# Patient Record
Sex: Male | Born: 1970 | Race: Black or African American | Hispanic: No | Marital: Single | Smoking: Never smoker
Health system: Southern US, Community
[De-identification: ages and names within clinical notes are randomized; demographics above are authoritative.]

## PROBLEM LIST (undated history)

## (undated) ENCOUNTER — Emergency Department (HOSPITAL_COMMUNITY): Payer: Self-pay | Source: Home / Self Care

## (undated) ENCOUNTER — Ambulatory Visit: Admission: EM | Discharge status: Absent without leave | Payer: Managed Care, Other (non HMO)

## (undated) ENCOUNTER — Emergency Department: Discharge status: Absent without leave | Payer: Self-pay

## (undated) DIAGNOSIS — I10 Essential (primary) hypertension: Secondary | ICD-10-CM

## (undated) DIAGNOSIS — Z9889 Other specified postprocedural states: Secondary | ICD-10-CM

## (undated) DIAGNOSIS — E785 Hyperlipidemia, unspecified: Secondary | ICD-10-CM

## (undated) HISTORY — PX: RECTAL SURGERY: SHX760

---

## 2003-09-13 ENCOUNTER — Emergency Department (HOSPITAL_COMMUNITY): Admission: EM | Admit: 2003-09-13 | Discharge: 2003-09-13 | Payer: Self-pay | Admitting: Emergency Medicine

## 2006-02-11 ENCOUNTER — Ambulatory Visit (HOSPITAL_COMMUNITY): Admission: RE | Admit: 2006-02-11 | Discharge: 2006-02-11 | Payer: Self-pay | Admitting: General Surgery

## 2006-02-19 ENCOUNTER — Ambulatory Visit: Payer: Self-pay | Admitting: Internal Medicine

## 2006-02-19 ENCOUNTER — Inpatient Hospital Stay (HOSPITAL_COMMUNITY): Admission: EM | Admit: 2006-02-19 | Discharge: 2006-03-06 | Payer: Self-pay | Admitting: *Deleted

## 2006-02-22 ENCOUNTER — Ambulatory Visit: Payer: Self-pay | Admitting: Infectious Diseases

## 2006-02-23 ENCOUNTER — Encounter: Payer: Self-pay | Admitting: Cardiology

## 2007-06-09 ENCOUNTER — Ambulatory Visit (HOSPITAL_BASED_OUTPATIENT_CLINIC_OR_DEPARTMENT_OTHER): Admission: RE | Admit: 2007-06-09 | Discharge: 2007-06-09 | Payer: Self-pay | Admitting: General Surgery

## 2010-10-08 NOTE — Op Note (Signed)
NAMELEONIDUS, ROWAND          ACCOUNT NO.:  [id number]   MEDICAL RECORD NO.:  [id number]          PATIENT TYPE:  AMB   LOCATION:  DSC                          FACILITY:  [facility]   PHYSICIAN:  Leonie Man, M.D.   DATE OF BIRTH:  03/20/1971   DATE OF PROCEDURE:  06/09/2007  DATE OF DISCHARGE:                               OPERATIVE REPORT   PREOPERATIVE DIAGNOSIS:  Anal fistula.   POSTOPERATIVE DIAGNOSIS:  Anal fistula.   PROCEDURE:  Anal fistulotomy.   SURGEON:  Leonie Man, M.D.   ASSISTANT:  Operating room technician.   ANESTHESIA:  General anesthesia.   INDICATIONS FOR PROCEDURE:  The patient is a 40 year old man, presenting  with drainage from the anus and noted on examination to have a fistula.  This appears on probing to be entirely submucosal without any sphincter  muscle involvement.  The patient comes to the operating room now for a  fistulotomy, after the risks and potential benefits of surgery have been  fully discussed with him and he accepts these.   DESCRIPTION OF PROCEDURE:  The patient is positioned in the lithotomy  position, following the induction of satisfactory general anesthesia.  The perianal tissues are prepped and draped, to be included in the  sterile operative field.  A digital dilatation of the anus is carried  out.  The bowel prep was noted to be poor.  Immediately I was able to  identify the fistula and was running a probe and grooved erector in  through the fistula.  I injected the overlying and surrounding tissues  with 0.5% Marcaine with epinephrine.  Using electrocautery, this was  dissected down to the base of the fistula.  The base of the fistula was  curetted and cauterized.  A Xeroform gauze pack was placed in the  fistula.  A sterile dressing was applied.  The anesthetic was reversed.   The patient was removed from the operating room to the recovery room in  stable condition.  He tolerated the procedure well.      Leonie Man, M.D.  Electronically Signed     PB/MEDQ  D:  06/09/2007  T:  06/09/2007  Job:  [id number]

## 2010-10-11 NOTE — Op Note (Signed)
Jerry Crane, Jerry Crane          ACCOUNT NO.:  [id number]   MEDICAL RECORD NO.:  [id number]          PATIENT TYPE:  INP   LOCATION:  0165                         [facility]ACILITY:  [facility]   PHYSICIAN:  Leonie Man, M.D.   DATE OF BIRTH:  January 04, 1971   DATE OF PROCEDURE:  02/19/2006  DATE OF DISCHARGE:                                 OPERATIVE REPORT   PREOPERATIVE DIAGNOSES:  Perineal (Fournier gangrene) with ischiorectal  abscess.   POSTOPERATIVE DIAGNOSES:  Perineal (Fournier gangrene) with ischiorectal  abscess.   PROCEDURE:  Incision and drainage of ischiorectal abscess with debridement  of Fournier gangrene of the perineum.   SURGEON:  Leonie Man, M.D.   SURGICAL CONSULTANT:  Adolph Pollack, M.D.   ANESTHESIA:  General.   NOTE:  Jerry Crane is a 41 year old man who presented with severe perineal  and buttock pain associated with nausea and vomiting.  On evaluation, noted  to have a large buttock abscess and a CT scan showing what was consistent  with Fournier gangrene of the perineum with infection extending up into the  inguinal canal and down into the buttocks, perirectal area and the  periprosthetic region.  The patient comes to the operating room now for  incision and drainage. The risks and potential benefits of surgery have been  discussed with him.   PROCEDURE:  Following the induction of satisfactory general anesthesia the  patient was positioned in the lithotomy position and the perianal tissues  and scrotum and groin prepped and draped after being included in a sterile  operative field.  I made a pararectal incision on the left buttock into a  large abscess cavity, extended the incision up to the perineum into his  subscrotal area and down into the buttock and debrided a very large abscess  cavity.  Cultures were taken, both aerobic and anaerobic bacteria.  Dissection was carried up into the inguinal ligament and I used a pulsatile  lavage apparatus to  debride the wound and to wash the entire abscess cavity  out to the largest extent possible.  The hemostasis was obtained with  electrocautery or with suture ligatures of 0 chromic catgut.  Sponge and  instrument counts were then verified and the wound was packed with saline-  dampened gauze packing, extending the packing up into the inguinal canal and  down into the buttock and perirectal areas.  Sterile dressings were then  applied, anesthetic reversed and the patient moved from the operating room  to the recovery room in stable condition.  He tolerated the procedure well.      Leonie Man, M.D.  Electronically Signed     PB/MEDQ  D:  02/19/2006  T:  02/21/2006  Job:  [id number]

## 2010-10-11 NOTE — Consult Note (Signed)
Jerry Crane, Jerry Crane          ACCOUNT NO.:  [id number]   MEDICAL RECORD NO.:  [id number]          PATIENT TYPE:  INP   LOCATION:  0165                         FACILITY:  [facility]   PHYSICIAN:  Pricilla Riffle, MD, FACCDATE OF BIRTH:  1970/09/03   DATE OF CONSULTATION:  02/20/2006  DATE OF DISCHARGE:                                   CONSULTATION   IDENTIFICATION:  The patient is a 40 year old whom we are asked to see  regarding PVC's.   HISTORY OF PRESENT ILLNESS:  The patient was admitted on February 19, 2006  for sphincterotomy for anal fissure/cellulitis of the left buttock, noted to  be in bigeminy this afternoon with couplets at around 1755.   In talking with the patient, he denied palpitations today or in the past.  No shortness of breath.  No chest pain.  No dizziness.  No syncope.  He was  very active prior to admission.   ALLERGIES:  NONE.   MEDICATIONS:  Now are Phenergan, Dilaudid, Unasyn and Primaxin.   PAST MEDICAL HISTORY:  Negative except as above.   FAMILY HISTORY:  No history of palpitations, arrhythmia or sudden death.   SOCIAL HISTORY:  He does not smoke and does not drink.   REVIEW OF SYSTEMS:  All systems reviewed, negative to the above problem,  except as noted.   PHYSICAL EXAMINATION:  GENERAL:  On exam the patient denies shortness of  breath, no chest pain.  VITAL SIGNS:  Blood pressure is 80-130's systolic, pulse is 80's-90's-110's.  T-max is 101.  I's and O's +1300.  HEENT:  Normocephalic, atraumatic.  EOMI.  PERRL.  NECK:  JVP is normal; no bruits.  LUNGS:  Clear.  CARDIAC:  Regular rate and rhythm.  S1 and S2; no S3 or S4.  No murmurs.  PMI not displaced.  ABDOMEN:  Benign.  EXTREMITIES:  2+ pulses.  No edema.   LABORATORY DATA:  Significant for a hemoglobin of 10.8, WBC of 19.3,  normocytic, normochromic indices.  Creatinine is 2.7 and potassium is 4.2   Urinalysis yesterday - specific gravity is 1.027.   RADIOLOGIC DATA:  Chest x-ray,  portable, shows cardiomegaly.   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  No 12-lead to capture.  EKG tonight shows tachycardia with a rate of 106, T wave inversion in lead  III and V3.   It appears his only precipitating factor is infection/hyperadrenergic state.  Exam is unremarkable.  EKG is unremarkable, other than sinus tachycardia.   RECOMMENDATIONS:  1. Would continue telemetry.  2. Add low-dose beta-blocker (Lopressor 25 b.i.d.) to his regimen.  3. Follow electrolytes.  4. 12-lead EKG if rhythm recurs.  5. Patient should have an echo at some point, even after discharge.  6. Will continue to follow.           ______________________________  Pricilla Riffle, MD, [facility]     PVR/MEDQ  D:  02/20/2006  T:  02/22/2006  Job:  [id number]   cc:  Leonie Man, M.D.  [address]  [address]  [place]  [place] [address]

## 2010-10-11 NOTE — Op Note (Signed)
NAMESAIVION, Jerry Crane          ACCOUNT NO.:  [id number]   MEDICAL RECORD NO.:  [id number]          PATIENT TYPE:  INP   LOCATION:  0165                         FACILITY:  [facility]   PHYSICIAN:  Leonie Man, M.D.   DATE OF BIRTH:  10/18/1970   DATE OF PROCEDURE:  02/25/2006  DATE OF DISCHARGE:                                 OPERATIVE REPORT   PREOPERATIVE DIAGNOSIS:  Necrotizing perineal infection.   POSTOPERATIVE DIAGNOSIS:  Necrotizing perineal infection.   PROCEDURE:  Debridement of peritoneal wound.   SURGEON:  Leonie Man, M.D.   ASSISTANT:  OR tech.   ANESTHESIA:  General.  [name] has been being brought back to the  operating room on an every other day basis for aggressive debridement of a  Fournier's gangrene of the perineum.  He comes to the operating room today  fully aware of the risks and potential benefits of surgery.   PROCEDURE:  Following induction of satisfactory general anesthesia, the  patient is placed in lithotomy position.  The previous packing was removed  from the wound and the area is prepped and draped to be included in a  sterile operative field and the wound is debrided of additional small  amounts of necrotic tissue.  Most the wound now is clean and granulating  satisfactorily.  There are no new pockets found within the interstices of  the wound.  Pulse lavage apparatus was used to hydro lavage to clean the  wound out.  A few bleeding points were treated with electrocautery and a  Kerlix gauze packing was placed into the interstices of the wound.  Sterile  dressings applied, the anesthetic reversed and the patient removed from the  operating room to the recovery room in stable condition.  He tolerated the  procedure well.      Leonie Man, M.D.  Electronically Signed     PB/MEDQ  D:  02/25/2006  T:  02/27/2006  Job:  [id number]

## 2010-10-11 NOTE — Discharge Summary (Signed)
Jerry Crane, Jerry Crane          ACCOUNT NO.:  [id number]   MEDICAL RECORD NO.:  [id number]          PATIENT TYPE:  INP   LOCATION:  1610                         FACILITY:  [facility]   PHYSICIAN:  Leonie Man, M.D.   DATE OF BIRTH:  1971-05-21   DATE OF ADMISSION:  02/19/2006  DATE OF DISCHARGE:  03/06/2006                               DISCHARGE SUMMARY   ADMISSION DIAGNOSIS:  Necrotizing perineal infection (Fournier's  gangrene).   DISCHARGE DIAGNOSIS:  Necrotizing perineal infection (Fournier's  gangrene).   PROCEDURES IN HOSPITAL:  debridement of necrotizing perineal infection  on February 19, 2006, September 29,2007, and February 23, 2006, February 24, 2006, and February 25, 2006.   CONSULTATIONS:  1. Infectious disease, Dr. Lina Sayre.  Antibiotic treatment      initiated with imipenem and vancomycin.  2. Dr. Dietrich Pates for evaluation of persistent tachycardia, without      any precise source except sepsis, treated unsuccessfully with      Lopressor.   DISCHARGE MEDICATIONS:  1. Augmentin 875 mg b.i.d. x2 weeks.  2. Vicodin 5/500 1 tablet q.4-6 h. p.r.n.   HOSPITAL COURSE:  [name] is a 41 year old man who was admitted to  the hospital for a large necrotizing perineal infection.  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  At the time of admission, he was noted to have a  very large necrotizing perineal infection and was immediately taken to  the operating room, where he underwent extensive debridement of the  perineum.  He was started on imipenem, and subsequently we added  vancomycin to his antibiotic regimen which was continued throughout the  course of his admission. He was taken to the operating room on several  occasions, as noted above, for more debridement of the perineum until  the infection came under local control.  During the course of his  hospitalization, he became quite tachycardiac and  was seen by Dr. Dietrich Pates for evaluation.  Her recommendations for treatment were to add low-  dose Lopressor, approximately 25 mg b.i.d. over a period of time.  All  cardiac evaluations otherwise were normal.  The patient improved over  the ensuing hospitalization, and on March 06, 2006 he was deemed fit  for discharge.  He is discharged with [facility] to  continue local care of his perineal wound, to be switched after a  certain period of time to local care done by the patient and his family.  Followup for the patient is scheduled for one week following discharge.      Leonie Man, M.D.  Electronically Signed     PB/MEDQ  D:  04/09/2006  T:  04/09/2006  Job:  [id number]

## 2010-10-11 NOTE — Op Note (Signed)
Jerry Crane, Jerry Crane          ACCOUNT NO.:  [id number]   MEDICAL RECORD NO.:  [id number]          PATIENT TYPE:  INP   LOCATION:  0165                         FACILITY:  [facility]   PHYSICIAN:  Clovis Pu. Cornett, M.D.DATE OF BIRTH:  [birth year]   DATE OF PROCEDURE:  02/22/2006  DATE OF DISCHARGE:                                 OPERATIVE REPORT   PREOPERATIVE DIAGNOSIS:  Fournier's gangrene.   POSTOPERATIVE DIAGNOSIS:  Fournier's gangrene.   PROCEDURE:  Irrigation and debridement of perineum.   SURGEON:  Maisie Fus A. Cornett, M.D.   ANESTHESIA:  LMA.   ESTIMATED BLOOD LOSS:  Less than 10 mL.   DRAINS:  None.   INDICATIONS FOR PROCEDURE:  The patient is a 40 year old male who had a  sphincterotomy with subsequent Fournier's gangrene.  He is brought back to  the operating room today for irrigation and debridement of his wounds since  he had copious amount of drainage this morning on rounds.  I explained this  to the patient and he voiced understanding and agreed to proceed.   DESCRIPTION OF PROCEDURE:  Patient is brought to the operating room and  placed supine.  After induction of anesthesia, he was placed in lithotomy.  Perineum was prepped and draped in sterile fashion.  Old packing was removed  from his left buttock wound and around my finger both inferior and  superiorly up toward the scrotum.  There were no undrained fluid  collections.  I irrigated the entire wound out with three liters of saline  using the Pulsavac.  I then repacked this with Betadine soaked Kerlix.  Dry  dressings were applied.  The patient tolerated the procedure well and was  taken to recovery in satisfactory condition.      Thomas A. Cornett, M.D.  Electronically Signed     TAC/MEDQ  D:  02/22/2006  T:  02/23/2006  Job:  [id number]

## 2010-10-11 NOTE — H&P (Signed)
Jerry Crane, Jerry Crane          ACCOUNT NO.:  [id number]   MEDICAL RECORD NO.:  [id number]          PATIENT TYPE:  INP   LOCATION:  1610                         FACILITY:  [facility]   PHYSICIAN:  Leonie Man, M.D.   DATE OF BIRTH:  [birth year]   DATE OF ADMISSION:  02/19/2006  DATE OF DISCHARGE:  03/06/2006                              HISTORY & PHYSICAL   PROBLEM:  Gas forming perineal infection.   The patient is a 40 year old man who is status post lateral internal  sphincterotomy for chronic anal fissure performed on February 11, 2006.  He did well until February 15, 2006 where he noted the onset of severe  perineal pain and on presentation was noted to have a large swollen  buttock and perineum.  He is admitted to [facility] now  following CT scans of the pelvis which showed gas forming perineal  infection consistent with a Fournier's' type gangrene.   PAST MEDICAL HISTORY:  No previous medical problems.  No no known  allergies.   PAST SURGERIES:  Status post lateral internal sphincterotomy.   FAMILY AND SOCIAL HISTORY:  Noncontributory.   REVIEW OF SYSTEMS:  Negative in detail except as outlined above.   PHYSICAL EXAMINATION:  The patient on admission has a blood pressure of  92/46 with a pulse of 110.  His respirations are 16.  HEENT: Head normocephalic.  Pupils round, regular.  No thyromegaly or  adenopathy.  CHEST:  Clear to auscultation.  HEART:  Tachycardia without murmurs.  ABDOMEN:  Soft, nontender, nondistended.  EXTREMITIES:  Range of motion within normal limits.  RECTAL:  Perineal exam shows a large swollen buttock area which is  severely tender and erythematous.   LABORATORY FINDINGS:  Shows white blood cell count be elevated to 22.7,  hemoglobin 13.1, hematocrit 37.  Urinalysis shows 3 to 6  white blood  cells per high-power field with many bacteria. His serum creatinine is  elevated to 3.2 with a BUN of 50.   ASSESSMENT:  Fournier's'  gangrene of the perineum following lateral  internal sphincterotomy.   PLAN:  To take him to the operating room for incision, drainage and  debridement of the perineum.     Leonie Man, M.D.  Electronically Signed    PB/MEDQ  D:  04/09/2006  T:  04/09/2006  Job:  [id number]

## 2010-10-11 NOTE — Op Note (Signed)
NAMEEVERITT, WENNER          ACCOUNT NO.:  [id number]   MEDICAL RECORD NO.:  [id number]          PATIENT TYPE:  AMB   LOCATION:  DAY                          FACILITY:  [facility]   PHYSICIAN:  Leonie Man, M.D.   DATE OF BIRTH:  [birth year]   DATE OF PROCEDURE:  02/11/2006  DATE OF DISCHARGE:                                 OPERATIVE REPORT   PREOPERATIVE DIAGNOSIS:  Chronic anal fissure.   POSTOPERATIVE DIAGNOSIS:  Chronic anal fissure.   PROCEDURE:  Lateral internal sphincterotomy.   SURGEON:  Leonie Man, M.D.   ASSISTANT:  Nurse.   ANESTHESIA:  General.   Mr. Evon Slack is a 40 year old [place] man who has a chronic nonhealing anal  fissure.  Treated only partially successfully with diltiazem cream but  continues to have pain and bleeding.  He comes to the operating room now  after risks and potential benefits of lateral internal sphincterotomy had  been fully discussed with him including the risk of possible incontinence.  He understands and gives consent to same.   PROCEDURE:  With the patient positioned in the lithotomy position following  induction of satisfactory general anesthesia, perianal tissues are prepped  and draped to be included in a sterile operative field.  Anal sphincter  dilated up to three fingerbreadths and an anoscope was placed into the  rectum.  His rectal prep was poor.  The region of the intersphincteric  groove was palpated and the mucosa over the internal sphincter was  infiltrated with 0.5% Marcaine with epinephrine.  The mucosa was opened and  a hemostat inserted into the intersphincteric groove elevating.  The lateral  portion of the internal sphincter, I used an 11 blade to cut through the  lateral internal sphincter up to approximately 1.5 cm.  Then closed the  overlying mucosa with interrupted sutures of 2-0 chromic and held pressure  on the wound.  At the end of this time all bleeding had subsided.  Sponge  and instrument counts  were then verified.  I placed a Gelfoam patch over the  incision.  The anesthetic was then reversed and the patient removed from the  operating room to the recovery room in stable condition.  He tolerated the  procedure well.      Leonie Man, M.D.  Electronically Signed     PB/MEDQ  D:  02/11/2006  T:  02/12/2006  Job:  [id number]

## 2010-10-11 NOTE — Op Note (Signed)
NAMEAXCEL, HORSCH          ACCOUNT NO.:  [id number]   MEDICAL RECORD NO.:  [id number]          PATIENT TYPE:  INP   LOCATION:  0165                         FACILITY:  [facility]   PHYSICIAN:  Leonie Man, M.D.   DATE OF BIRTH:  [birth year]   DATE OF PROCEDURE:  02/23/2006  DATE OF DISCHARGE:                                 OPERATIVE REPORT   PREOPERATIVE DIAGNOSIS:  Necrotizing perineal infection.   POSTOPERATIVE DIAGNOSIS:  Necrotizing perineal infection.   PROCEDURE:  Debridement perineal wound number 4 with removal of skin and  soft tissue.   SURGEON:  Leonie Man, M.D.   ASSISTANT:  OR tech.   ANESTHESIA:  General.   NOTE:  Mr. Ninfa Meeker is a 40 year old man with necrotizing perineal  infection.  He was brought to the operating room originally on February 19, 2006, and he is being brought back to the operating room on now his fourth  occasion for debridement of Fournier's gangrene of the perineum.   PROCEDURE:  The patient is positioned supinely and then placed in lithotomy  position. Following induction of satisfactory general anesthesia, the  packing placed in the depths of the wound was removed and the perineum was  prepped and draped to be included in a sterile operative field.  A piece of  necrotic skin and the subcutaneous tissues was debrided from the perineum as  well as several additional areas of necrotizing fatty tissue.  We have  gotten down to clean granulating muscular tissue at this point.  There is a  tunnel that goes up along the left pararectal ischiorectal space which was  opened.  I used a pulse lavage apparatus to irrigate that cavity as well as  the cavity going up into the left lateral scrotum and into the inguinal  canal.  There were other cavities going posteriorly into the buttock which  were cleaned and debrided of necrotic tissues.  The pulse lavage apparatus  was used to irrigate out all of the debris that could be seen. Hemostasis  was obtained with electrocautery and with sutures of 0 chromic catgut.  Following debridement and irrigation, the wound was packed so as to make  sure packing went up into the inguinal canal into the parascrotal area and  into the ischiorectal fossa as well as down into the interstices within the  buttocks.  This was done with a Kerlix gauze soaked in dilute Betadine  solution.  A sterile dressing was then placed on the wound.  The anesthetic  was reversed and the patient removed from the operating room to the recovery  room in stable condition. He tolerated the procedure well.      Leonie Man, M.D.  Electronically Signed     PB/MEDQ  D:  02/23/2006  T:  02/24/2006  Job:  [id number]

## 2010-10-11 NOTE — Op Note (Signed)
Jerry Crane, MULA          ACCOUNT NO.:  [id number]   MEDICAL RECORD NO.:  [id number]          PATIENT TYPE:  INP   LOCATION:  0165                         FACILITY:  [facility]   PHYSICIAN:  Leonie Man, M.D.   DATE OF BIRTH:  10/24/1970   DATE OF PROCEDURE:  02/21/2006  DATE OF DISCHARGE:                                 OPERATIVE REPORT   PREOPERATIVE DIAGNOSIS:  Necrotizing perineal infection.   POSTOPERATIVE DIAGNOSIS:  Necrotizing perineal infection.   PROCEDURE:  Exploration and debridement of necrotizing perineal wounds.   SURGEON:  Leonie Man, M.D.   ASSISTANT:  OR tech.   ANESTHESIA:  General.   INDICATIONS:  The patient is a 40 year old man who presents with a  necrotizing perineal infection (Fournier's) who underwent incision and  drainage and debridement of his perineum on the February 19, 2006.  He is  returned to the operating room now for continued debridement.   PROCEDURE:  Following the induction of satisfactory general anesthesia, the  patient is placed in stirrups in the lithotomy position.  The previously  placed gauze packing is removed and discarded.  The wound is then irrigated  with a pulse lavage apparatus and aspirated of all of its  purulent content.  Necrotic tissues within the depths of the wound of the perineum and left  buttock wound were sharply debrided.  Hemostasis was obtained with  electrocautery or with stitches of 0 chromic catgut.  All visible nonviable  tissues were removed all the way up from the inguinal canal all the way down  to the buttock and perineum.  The wound was then thoroughly irrigated with a  dilute Betadine solution, and a Kerlix gauze packing was placed into all of  the interstices within the wound.  A sterile dressing was applied.   The anesthetic was reversed and the patient removed to the PACU in stable  condition.  He tolerated the procedure well.      Leonie Man, M.D.  Electronically  Signed     PB/MEDQ  D:  02/21/2006  T:  02/23/2006  Job:  [id number]   cc:   Leonie Man, M.D.  [address]  [address]  [place]  [place] [address]

## 2011-02-13 LAB — POCT HEMOGLOBIN-HEMACUE: Hemoglobin: 14.9

## 2015-10-23 ENCOUNTER — Emergency Department (HOSPITAL_COMMUNITY)
Admission: EM | Admit: 2015-10-23 | Discharge: 2015-10-23 | Disposition: A | Discharge status: Home / Self Care | Payer: No Typology Code available for payment source | Attending: Emergency Medicine | Admitting: Emergency Medicine

## 2015-10-23 ENCOUNTER — Encounter (HOSPITAL_COMMUNITY): Payer: Self-pay | Admitting: Emergency Medicine

## 2015-10-23 DIAGNOSIS — Z791 Long term (current) use of non-steroidal anti-inflammatories (NSAID): Secondary | ICD-10-CM | POA: Diagnosis not present

## 2015-10-23 DIAGNOSIS — Y92410 Unspecified street and highway as the place of occurrence of the external cause: Secondary | ICD-10-CM | POA: Diagnosis not present

## 2015-10-23 DIAGNOSIS — M542 Cervicalgia: Secondary | ICD-10-CM | POA: Diagnosis not present

## 2015-10-23 DIAGNOSIS — I10 Essential (primary) hypertension: Secondary | ICD-10-CM | POA: Insufficient documentation

## 2015-10-23 DIAGNOSIS — Y939 Activity, unspecified: Secondary | ICD-10-CM | POA: Insufficient documentation

## 2015-10-23 DIAGNOSIS — M545 Low back pain: Secondary | ICD-10-CM | POA: Insufficient documentation

## 2015-10-23 DIAGNOSIS — Y999 Unspecified external cause status: Secondary | ICD-10-CM | POA: Insufficient documentation

## 2015-10-23 HISTORY — DX: Essential (primary) hypertension: I10

## 2015-10-23 MED ORDER — CYCLOBENZAPRINE HCL 10 MG PO TABS: 10.0000 mg | ORAL_TABLET | Freq: Two times a day (BID) | ORAL | Status: DC | PRN

## 2015-10-23 MED ORDER — IBUPROFEN 800 MG PO TABS
800.0000 mg | ORAL_TABLET | Freq: Once | ORAL | Status: AC
  Administered 2015-10-23: 800 mg via ORAL
  Filled 2015-10-23: qty 1

## 2015-10-23 MED ORDER — NAPROXEN 500 MG PO TABS: 500.0000 mg | ORAL_TABLET | Freq: Two times a day (BID) | ORAL | Status: DC
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
## 2015-10-23 NOTE — Discharge Instructions (Signed)
°Cervical Strain and Sprain With Rehab °Cervical strain and sprain are injuries that commonly occur with "whiplash" injuries. Whiplash occurs when the neck is forcefully whipped backward or forward, such as during a motor vehicle accident or during contact sports. The muscles, ligaments, tendons, discs, and nerves of the neck are susceptible to injury when this occurs. °RISK FACTORS °Risk of having a whiplash injury increases if: °· Osteoarthritis of the spine. °· Situations that make head or neck accidents or trauma more likely. °· High-risk sports (football, rugby, wrestling, hockey, auto racing, gymnastics, diving, contact karate, or boxing). °· Poor strength and flexibility of the neck. °· Previous neck injury. °· Poor tackling technique. °· Improperly fitted or padded equipment. °SYMPTOMS  °· Pain or stiffness in the front or back of neck or both. °· Symptoms may present immediately or up to 24 hours after injury. °· Dizziness, headache, nausea, and vomiting. °· Muscle spasm with soreness and stiffness in the neck. °· Tenderness and swelling at the injury site. °PREVENTION °· Learn and use proper technique (avoid tackling with the head, spearing, and head-butting; use proper falling techniques to avoid landing on the head). °· Warm up and stretch properly before activity. °· Maintain physical fitness: °¨ Strength, flexibility, and endurance. °¨ Cardiovascular fitness. °· Wear properly fitted and padded protective equipment, such as padded soft collars, for participation in contact sports. °PROGNOSIS  °Recovery from cervical strain and sprain injuries is dependent on the extent of the injury. These injuries are usually curable in 1 week to 3 months with appropriate treatment.  °RELATED COMPLICATIONS  °· Temporary numbness and weakness may occur if the nerve roots are damaged, and this may persist until the nerve has completely healed. °· Chronic pain due to frequent recurrence of symptoms. °· Prolonged healing,  especially if activity is resumed too soon (before complete recovery). °TREATMENT  °Treatment initially involves the use of ice and medication to help reduce pain and inflammation. It is also important to perform strengthening and stretching exercises and modify activities that worsen symptoms so the injury does not get worse. These exercises may be performed at home or with a therapist. For patients who experience severe symptoms, a soft, padded collar may be recommended to be worn around the neck.  °Improving your posture may help reduce symptoms. Posture improvement includes pulling your chin and abdomen in while sitting or standing. If you are sitting, sit in a firm chair with your buttocks against the back of the chair. While sleeping, try replacing your pillow with a small towel rolled to 2 inches in diameter, or use a cervical pillow or soft cervical collar. Poor sleeping positions delay healing.  °For patients with nerve root damage, which causes numbness or weakness, the use of a cervical traction apparatus may be recommended. Surgery is rarely necessary for these injuries. However, cervical strain and sprains that are present at birth (congenital) may require surgery. °MEDICATION  °· If pain medication is necessary, nonsteroidal anti-inflammatory medications, such as aspirin and ibuprofen, or other minor pain relievers, such as acetaminophen, are often recommended. °· Do not take pain medication for 7 days before surgery. °· Prescription pain relievers may be given if deemed necessary by your caregiver. Use only as directed and only as much as you need. °HEAT AND COLD:  °· Cold treatment (icing) relieves pain and reduces inflammation. Cold treatment should be applied for 10 to 15 minutes every 2 to 3 hours for inflammation and pain and immediately after any activity that aggravates your   HEAT AND COLD:   · Cold treatment (icing) relieves pain and reduces inflammation. Cold treatment should be applied for 10 to 15 minutes every 2 to 3 hours for inflammation and pain and immediately after any activity that aggravates your symptoms. Use ice packs or an ice massage.  · Heat treatment may be used prior to performing the stretching and  strengthening activities prescribed by your caregiver, physical therapist, or athletic trainer. Use a heat pack or a warm soak.  SEEK MEDICAL CARE IF:   · Symptoms get worse or do not improve in 2 weeks despite treatment.  · New, unexplained symptoms develop (drugs used in treatment may produce side effects).  EXERCISES  RANGE OF MOTION (ROM) AND STRETCHING EXERCISES - Cervical Strain and Sprain  These exercises may help you when beginning to rehabilitate your injury. In order to successfully resolve your symptoms, you must improve your posture. These exercises are designed to help reduce the forward-head and rounded-shoulder posture which contributes to this condition. Your symptoms may resolve with or without further involvement from your physician, physical therapist or athletic trainer. While completing these exercises, remember:   · Restoring tissue flexibility helps normal motion to return to the joints. This allows healthier, less painful movement and activity.  · An effective stretch should be held for at least 20 seconds, although you may need to begin with shorter hold times for comfort.  · A stretch should never be painful. You should only feel a gentle lengthening or release in the stretched tissue.  STRETCH- Axial Extensors  · Lie on your back on the floor. You may bend your knees for comfort. Place a rolled-up hand towel or dish towel, about 2 inches in diameter, under the part of your head that makes contact with the floor.  · Gently tuck your chin, as if trying to make a "double chin," until you feel a gentle stretch at the base of your head.  · Hold __________ seconds.  Repeat __________ times. Complete this exercise __________ times per day.   STRETCH - Axial Extension   · Stand or sit on a firm surface. Assume a good posture: chest up, shoulders drawn back, abdominal muscles slightly tense, knees unlocked (if standing) and feet hip width apart.  · Slowly retract your chin so your head slides back  and your chin slightly lowers. Continue to look straight ahead.  · You should feel a gentle stretch in the back of your head. Be certain not to feel an aggressive stretch since this can cause headaches later.  · Hold for __________ seconds.  Repeat __________ times. Complete this exercise __________ times per day.  STRETCH - Cervical Side Bend   · Stand or sit on a firm surface. Assume a good posture: chest up, shoulders drawn back, abdominal muscles slightly tense, knees unlocked (if standing) and feet hip width apart.  · Without letting your nose or shoulders move, slowly tip your right / left ear to your shoulder until your feel a gentle stretch in the muscles on the opposite side of your neck.  · Hold __________ seconds.  Repeat __________ times. Complete this exercise __________ times per day.  STRETCH - Cervical Rotators   · Stand or sit on a firm surface. Assume a good posture: chest up, shoulders drawn back, abdominal muscles slightly tense, knees unlocked (if standing) and feet hip width apart.  · Keeping your eyes level with the ground, slowly turn your head until you feel a gentle stretch along   the back and opposite side of your neck.  · Hold __________ seconds.  Repeat __________ times. Complete this exercise __________ times per day.  RANGE OF MOTION - Neck Circles   · Stand or sit on a firm surface. Assume a good posture: chest up, shoulders drawn back, abdominal muscles slightly tense, knees unlocked (if standing) and feet hip width apart.  · Gently roll your head down and around from the back of one shoulder to the back of the other. The motion should never be forced or painful.  · Repeat the motion 10-20 times, or until you feel the neck muscles relax and loosen.  Repeat __________ times. Complete the exercise __________ times per day.  STRENGTHENING EXERCISES - Cervical Strain and Sprain  These exercises may help you when beginning to rehabilitate your injury. They may resolve your symptoms with or  without further involvement from your physician, physical therapist, or athletic trainer. While completing these exercises, remember:   · Muscles can gain both the endurance and the strength needed for everyday activities through controlled exercises.  · Complete these exercises as instructed by your physician, physical therapist, or athletic trainer. Progress the resistance and repetitions only as guided.  · You may experience muscle soreness or fatigue, but the pain or discomfort you are trying to eliminate should never worsen during these exercises. If this pain does worsen, stop and make certain you are following the directions exactly. If the pain is still present after adjustments, discontinue the exercise until you can discuss the trouble with your clinician.  STRENGTH - Cervical Flexors, Isometric  · Face a wall, standing about 6 inches away. Place a small pillow, a ball about 6-8 inches in diameter, or a folded towel between your forehead and the wall.  · Slightly tuck your chin and gently push your forehead into the soft object. Push only with mild to moderate intensity, building up tension gradually. Keep your jaw and forehead relaxed.  · Hold 10 to 20 seconds. Keep your breathing relaxed.  · Release the tension slowly. Relax your neck muscles completely before you start the next repetition.  Repeat __________ times. Complete this exercise __________ times per day.  STRENGTH- Cervical Lateral Flexors, Isometric   · Stand about 6 inches away from a wall. Place a small pillow, a ball about 6-8 inches in diameter, or a folded towel between the side of your head and the wall.  · Slightly tuck your chin and gently tilt your head into the soft object. Push only with mild to moderate intensity, building up tension gradually. Keep your jaw and forehead relaxed.  · Hold 10 to 20 seconds. Keep your breathing relaxed.  · Release the tension slowly. Relax your neck muscles completely before you start the next  repetition.  Repeat __________ times. Complete this exercise __________ times per day.  STRENGTH - Cervical Extensors, Isometric   · Stand about 6 inches away from a wall. Place a small pillow, a ball about 6-8 inches in diameter, or a folded towel between the back of your head and the wall.  · Slightly tuck your chin and gently tilt your head back into the soft object. Push only with mild to moderate intensity, building up tension gradually. Keep your jaw and forehead relaxed.  · Hold 10 to 20 seconds. Keep your breathing relaxed.  · Release the tension slowly. Relax your neck muscles completely before you start the next repetition.  Repeat __________ times. Complete this exercise __________ times per day.    All of your joints have less wear and tear when properly supported by a spine with good posture. This means you will experience a healthier, less painful body. °· Correct posture must be practiced with all of your activities, especially prolonged sitting and standing. Correct posture is as important when doing repetitive low-stress activities (typing) as it is when doing a single heavy-load activity (lifting). °PROLONGED STANDING WHILE SLIGHTLY LEANING FORWARD °When completing a task that requires you to lean forward while standing in one  place for a long time, place either foot up on a stationary 2- to 4-inch high object to help maintain the best posture. When both feet are on the ground, the low back tends to lose its slight inward curve. If this curve flattens (or becomes too large), then the back and your other joints will experience too much stress, fatigue more quickly, and can cause pain.  °RESTING POSITIONS °Consider which positions are most painful for you when choosing a resting position. If you have pain with flexion-based activities (sitting, bending, stooping, squatting), choose a position that allows you to rest in a less flexed posture. You would want to avoid curling into a fetal position on your side. If your pain worsens with extension-based activities (prolonged standing, working overhead), avoid resting in an extended position such as sleeping on your stomach. Most people will find more comfort when they rest with their spine in a more neutral position, neither too rounded nor too arched. Lying on a non-sagging bed on your side with a pillow between your knees, or on your back with a pillow under your knees will often provide some relief. Keep in mind, being in any one position for a prolonged period of time, no matter how correct your posture, can still lead to stiffness. °WALKING °Walk with an upright posture. Your ears, shoulders, and hips should all line up. °OFFICE WORK °When working at a desk, create an environment that supports good, upright posture. Without extra support, muscles fatigue and lead to excessive strain on joints and other tissues. °CHAIR: °· A chair should be able to slide under your desk when your back makes contact with the back of the chair. This allows you to work closely. °· The chair's height should allow your eyes to be level with the upper part of your monitor and your hands to be slightly lower than your elbows. °· Body position: °¨ Your feet should make contact with the floor. If this is not  possible, use a foot rest. °¨ Keep your ears over your shoulders. This will reduce stress on your neck and low back. °  °This information is not intended to replace advice given to you by your health care provider. Make sure you discuss any questions you have with your health care provider. °  °Document Released: 05/12/2005 Document Revised: 06/02/2014 Document Reviewed: 08/24/2008 °[employer] Interactive Patient Education ©2016 [employer] °Motor Vehicle Collision °It is common to have multiple bruises and sore muscles after a motor vehicle collision (MVC). These tend to feel worse for the first 24 hours. You may have the most stiffness and soreness over the first several hours. You may also feel worse when you wake up the first morning after your collision. After this point, you will usually begin to improve with each day. The speed of improvement often depends on the severity of the collision, the number of injuries, and the location and nature of these injuries. °HOME CARE INSTRUCTIONS °· Put ice on the injured area. °·   Put ice in a plastic bag.  Place a towel between your skin and the bag.  Leave the ice on for 15-20 minutes, 3-4 times a day, or as directed by your health care provider.  Drink enough fluids to keep your urine clear or pale yellow. Do not drink alcohol.  Take a warm shower or bath once or twice a day. This will increase blood flow to sore muscles.  You may return to activities as directed by your caregiver. Be careful when lifting, as this may aggravate neck or back pain.  Only take over-the-counter or prescription medicines for pain, discomfort, or fever as directed by your caregiver. Do not use aspirin. This may increase bruising and bleeding. SEEK IMMEDIATE MEDICAL CARE IF:  You have numbness, tingling, or weakness in the arms or legs.  You develop severe headaches not relieved with medicine.  You have severe neck pain, especially tenderness in the middle of the back of  your neck.  You have changes in bowel or bladder control.  There is increasing pain in any area of the body.  You have shortness of breath, light-headedness, dizziness, or fainting.  You have chest pain.  You feel sick to your stomach (nauseous), throw up (vomit), or sweat.  You have increasing abdominal discomfort.  There is blood in your urine, stool, or vomit.  You have pain in your shoulder (shoulder strap areas).  You feel your symptoms are getting worse. MAKE SURE YOU:  Understand these instructions.  Will watch your condition.  Will get help right away if you are not doing well or get worse.   This information is not intended to replace advice given to you by your health care provider. Make sure you discuss any questions you have with your health care provider.  Apply ice to affected area. Take muscle anti-inflammatories as needed for pain. Follow up with your PCP in one week if symptoms not improved. Return to the ED if you experience any worsening of your symptoms, loss of consciousness, blurry vision, vomiting, loss of control of your bowel and bladder, numbness/tingling in both of your lower extremities.
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
## 2015-10-25 ENCOUNTER — Other Ambulatory Visit: Payer: Self-pay | Admitting: Emergency Medicine

## 2015-10-25 ENCOUNTER — Ambulatory Visit (INDEPENDENT_AMBULATORY_CARE_PROVIDER_SITE_OTHER): Payer: Self-pay

## 2015-10-25 DIAGNOSIS — M4316 Spondylolisthesis, lumbar region: Secondary | ICD-10-CM

## 2015-10-25 DIAGNOSIS — R52 Pain, unspecified: Secondary | ICD-10-CM

## 2015-10-25 DIAGNOSIS — M542 Cervicalgia: Secondary | ICD-10-CM

## 2019-05-04 ENCOUNTER — Other Ambulatory Visit: Payer: Self-pay

## 2019-05-04 ENCOUNTER — Encounter (HOSPITAL_COMMUNITY): Payer: Self-pay | Admitting: Emergency Medicine

## 2019-05-04 ENCOUNTER — Emergency Department (HOSPITAL_COMMUNITY): Payer: No Typology Code available for payment source

## 2019-05-04 ENCOUNTER — Emergency Department (HOSPITAL_COMMUNITY)
Admission: EM | Admit: 2019-05-04 | Discharge: 2019-05-04 | Disposition: A | Discharge status: Home / Self Care | Payer: No Typology Code available for payment source | Attending: Emergency Medicine | Admitting: Emergency Medicine

## 2019-05-04 DIAGNOSIS — M542 Cervicalgia: Secondary | ICD-10-CM | POA: Diagnosis present

## 2019-05-04 DIAGNOSIS — I10 Essential (primary) hypertension: Secondary | ICD-10-CM | POA: Insufficient documentation

## 2019-05-04 DIAGNOSIS — Y939 Activity, unspecified: Secondary | ICD-10-CM | POA: Diagnosis not present

## 2019-05-04 DIAGNOSIS — S161XXA Strain of muscle, fascia and tendon at neck level, initial encounter: Secondary | ICD-10-CM | POA: Insufficient documentation

## 2019-05-04 DIAGNOSIS — Y990 Civilian activity done for income or pay: Secondary | ICD-10-CM | POA: Diagnosis not present

## 2019-05-04 DIAGNOSIS — M545 Low back pain, unspecified: Secondary | ICD-10-CM

## 2019-05-04 DIAGNOSIS — Y929 Unspecified place or not applicable: Secondary | ICD-10-CM | POA: Insufficient documentation

## 2019-05-04 MED ORDER — NAPROXEN 500 MG PO TABS: 500.0000 mg | ORAL_TABLET | Freq: Two times a day (BID) | ORAL | 0 refills | Status: DC

## 2019-05-04 MED ORDER — ACETAMINOPHEN 325 MG PO TABS
650.0000 mg | ORAL_TABLET | Freq: Once | ORAL | Status: AC
  Administered 2019-05-04: 650 mg via ORAL
  Filled 2019-05-04: qty 2

## 2019-05-04 MED ORDER — CYCLOBENZAPRINE HCL 10 MG PO TABS: 10.0000 mg | ORAL_TABLET | Freq: Two times a day (BID) | ORAL | 0 refills | Status: DC | PRN

## 2019-05-04 NOTE — ED Notes (Signed)
Patient transported to CT 
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
## 2019-05-04 NOTE — Discharge Instructions (Addendum)
Follow-up with your Worker's Comp. Provider. You may take Flexeril for muscle spasm as long as you are not driving or operating machinery. You may take naproxen twice daily as prescribed as needed for pain. Apply warm compresses to sore muscles for 30 minutes 3 times daily.

## 2019-09-09 ENCOUNTER — Other Ambulatory Visit: Payer: Self-pay | Admitting: Neurological Surgery

## 2019-09-26 NOTE — Progress Notes (Signed)
[facility] [address] [place], [place] - [address] [place] [address] [address] Phone: [phone] Fax: [phone]      Your procedure is scheduled on Friday May 7  Report to [facility] Main Entrance "A" at 0530 A.M., and check in at the Admitting office.  Call this number if you have problems the morning of surgery:  [phone]  Call [phone] if you have any questions prior to your surgery date Monday-Friday 8am-4pm    Remember:  Do not eat or drink after midnight the night before your surgery    Take these medicines the morning of surgery with A SIP OF WATER amLODipine (NORVASC)  atorvastatin (LIPITOR)  As of today, STOP taking any Aspirin (unless otherwise instructed by your surgeon) and Aspirin containing products, Aleve, Naproxen, Ibuprofen, Motrin, Advil, Goody's, BC's, all herbal medications, fish oil, and all vitamins.                      Do not wear jewelry            Do not wear lotions, powders, colognes, or deodorant.            Men may shave face and neck.            Do not bring valuables to the hospital.            [facility] is not responsible for any belongings or valuables.  Do NOT Smoke (Tobacco/Vapping) or drink Alcohol 24 hours prior to your procedure If you use a CPAP at night, you may bring all equipment for your overnight stay.   Contacts, glasses, dentures or bridgework may not be worn into surgery.      For patients admitted to the hospital, discharge time will be determined by your treatment team.   Patients discharged the day of surgery will not be allowed to drive home, and someone needs to stay with them for 24 hours.    Special instructions:   [facility]- Preparing For Surgery  Before surgery, you can play an important role. Because skin is not sterile, your skin needs to be as free of germs as possible. You can reduce the number of germs on your skin by washing with CHG (chlorahexidine gluconate) Soap  before surgery.  CHG is an antiseptic cleaner which kills germs and bonds with the skin to continue killing germs even after washing.    Oral Hygiene is also important to reduce your risk of infection.  Remember - BRUSH YOUR TEETH THE MORNING OF SURGERY WITH YOUR REGULAR TOOTHPASTE  Please do not use if you have an allergy to CHG or antibacterial soaps. If your skin becomes reddened/irritated stop using the CHG.  Do not shave (including legs and underarms) for at least 48 hours prior to first CHG shower. It is OK to shave your face.  Please follow these instructions carefully.   1. Shower the NIGHT BEFORE SURGERY and the MORNING OF SURGERY with CHG Soap.   2. If you chose to wash your hair, wash your hair first as usual with your normal shampoo.  3. After you shampoo, rinse your hair and body thoroughly to remove the shampoo.  4. Use CHG as you would any other liquid soap. You can apply CHG directly to the skin and wash gently with a scrungie or a clean washcloth.   5. Apply the CHG Soap to your body ONLY FROM THE NECK DOWN.  Do not  use on open wounds or open sores. Avoid contact with your eyes, ears, mouth and genitals (private parts). Wash Face and genitals (private parts)  with your normal soap.   6. Wash thoroughly, paying special attention to the area where your surgery will be performed.  7. Thoroughly rinse your body with warm water from the neck down.  8. DO NOT shower/wash with your normal soap after using and rinsing off the CHG Soap.  9. Pat yourself dry with a CLEAN TOWEL.  10. Wear CLEAN PAJAMAS to bed the night before surgery, wear comfortable clothes the morning of surgery  11. Place CLEAN SHEETS on your bed the night of your first shower and DO NOT SLEEP WITH PETS.   Day of Surgery:   Do not apply any deodorants/lotions.  Please wear clean clothes to the hospital/surgery center.   Remember to brush your teeth WITH YOUR REGULAR TOOTHPASTE.   Please read over the  following fact sheets that you were given.

## 2019-09-27 ENCOUNTER — Other Ambulatory Visit: Payer: Self-pay

## 2019-09-27 ENCOUNTER — Inpatient Hospital Stay (HOSPITAL_COMMUNITY): Admission: RE | Admit: 2019-09-27 | Payer: Self-pay | Source: Ambulatory Visit

## 2019-09-27 ENCOUNTER — Encounter (HOSPITAL_COMMUNITY): Payer: Self-pay

## 2019-09-27 ENCOUNTER — Encounter (HOSPITAL_COMMUNITY)
Admission: RE | Admit: 2019-09-27 | Discharge: 2019-09-27 | Disposition: A | Discharge status: Home / Self Care | Payer: Self-pay | Source: Ambulatory Visit | Attending: Neurological Surgery | Admitting: Neurological Surgery

## 2019-09-27 ENCOUNTER — Ambulatory Visit (HOSPITAL_COMMUNITY)
Admission: RE | Admit: 2019-09-27 | Discharge: 2019-09-27 | Disposition: A | Discharge status: Home / Self Care | Payer: Self-pay | Source: Ambulatory Visit | Attending: Neurological Surgery | Admitting: Neurological Surgery

## 2019-09-27 DIAGNOSIS — M4310 Spondylolisthesis, site unspecified: Secondary | ICD-10-CM

## 2019-09-27 HISTORY — DX: Other specified postprocedural states: Z98.890

## 2019-09-27 HISTORY — DX: Hyperlipidemia, unspecified: E78.5

## 2019-09-27 LAB — SURGICAL PCR SCREEN
MRSA, PCR: NEGATIVE
Staphylococcus aureus: NEGATIVE

## 2019-09-27 LAB — CBC WITH DIFFERENTIAL/PLATELET
Abs Immature Granulocytes: 0.01 10*3/uL (ref 0.00–0.07)
Basophils Absolute: 0 10*3/uL (ref 0.0–0.1)
Basophils Relative: 0 %
Eosinophils Absolute: 0.2 10*3/uL (ref 0.0–0.5)
Eosinophils Relative: 4 %
HCT: 41.2 % (ref 39.0–52.0)
Hemoglobin: 13.8 g/dL (ref 13.0–17.0)
Immature Granulocytes: 0 %
Lymphocytes Relative: 42 %
Lymphs Abs: 1.9 10*3/uL (ref 0.7–4.0)
MCH: 29.6 pg (ref 26.0–34.0)
MCHC: 33.5 g/dL (ref 30.0–36.0)
MCV: 88.4 fL (ref 80.0–100.0)
Monocytes Absolute: 0.4 10*3/uL (ref 0.1–1.0)
Monocytes Relative: 8 %
Neutro Abs: 2.1 10*3/uL (ref 1.7–7.7)
Neutrophils Relative %: 46 %
Platelets: 254 10*3/uL (ref 150–400)
RBC: 4.66 MIL/uL (ref 4.22–5.81)
RDW: 12.5 % (ref 11.5–15.5)
WBC: 4.5 10*3/uL (ref 4.0–10.5)
nRBC: 0 % (ref 0.0–0.2)

## 2019-09-27 LAB — PROTIME-INR
INR: 1 (ref 0.8–1.2)
Prothrombin Time: 13 seconds (ref 11.4–15.2)

## 2019-09-27 LAB — BASIC METABOLIC PANEL
Anion gap: 6 (ref 5–15)
BUN: 13 mg/dL (ref 6–20)
CO2: 25 mmol/L (ref 22–32)
Calcium: 9.3 mg/dL (ref 8.9–10.3)
Chloride: 109 mmol/L (ref 98–111)
Creatinine, Ser: 1.02 mg/dL (ref 0.61–1.24)
GFR calc Af Amer: >60 mL/min (ref >60)
GFR calc non Af Amer: >60 mL/min (ref >60)
Glucose, Bld: 90 mg/dL (ref 70–99)
Potassium: 4 mmol/L (ref 3.5–5.1)
Sodium: 140 mmol/L (ref 135–145)

## 2019-09-27 LAB — TYPE AND SCREEN
ABO/RH(D): AB POS
Antibody Screen: NEGATIVE

## 2019-09-27 LAB — ABO/RH: ABO/RH(D): AB POS

## 2019-09-27 NOTE — Progress Notes (Addendum)
PCP: Dr. Farris Has Cardiologist: denies   EKG: Today CXR: Today ECHO: 2007 Stress Test: denies Cardiac Cath: denies  C[facility] appt 5/6  Patient denies shortness of breath, fever, cough, and chest pain at PAT appointment.  Patient verbalized understanding of instructions provided today at the PAT appointment.  Patient asked to review instructions at home and day of surgery.   AddendumFayrene Fearing called to see pt regarding EKG

## 2019-09-28 NOTE — Progress Notes (Signed)
Anesthesia Chart Review:  Asked to review Abnormal EKG done at PAT. Tracing shows inverted T waves in Lead III, with biphasic waved in aVL. I spoke with patient, he denied any cardiac history. He says he did have a cardiac evaluation after surgery in 2007 due to abnormal EKG but was told everything was okay. Currently, he denies any CP or SOB. His activity is limited only by his back pain. He can climb more than 2 flights of stairs with dyspnea or CP. He is treated for HTN and HLD. He is not diabetic. Never smoker. Review of records in Epic shows that in 2007 he had postoperative ventricular bigeminy and cardiology was consulted. EKG at that time also showed inverted T waves in lead III. He subsequently had echo 05/14/2006 showing EF 55 to 60%, mildly increased left ventricular wall thickness, mildly increased peak pulmonary artery systolic pressure.  The patient reports he also had a treadmill test ordered by Dr. Anne Fu that he recalls being told was normal.  Dr. Anne Fu was with [facility] cardiology at the time, and these records were requested, received, and reviewed. Per note 02/10/08, " I performed an exercise treadmill test today which was low risk with no evidence of ischemia.  Rare PVC.  He exercised for total of 10 minutes and 26 seconds.  His resting blood pressure was 146/100 and increased appropriately."  I reviewed the case with Dr. Jean Rosenthal.  She reviewed patient's EKG tracings and echocardiogram.  Given that he has good functional status and no cardiovascular symptoms, she advised that he can proceed as planned barring acute status change.  Preop labs reviewed, within normal limits.  EKG 09/27/2019: Sinus bradycardia.  Rate 55.  T wave inversion in lead III.  EKG 02/21/2006 (copy on chart): Sinus rhythm with occasional premature ventricular complexes.  Rate 90.  T wave inversion in lead III.  CHEST - 2 VIEW 09/27/19  COMPARISON:  02/21/2006  FINDINGS: The heart size and mediastinal contours are  within normal limits. Both lungs are clear. The visualized skeletal structures are unremarkable.  IMPRESSION: No active cardiopulmonary disease.  TTE 05/14/2006: Summary: Overall left ventricular systolic function was normal.  Left ventricular ejection fraction was estimated, range being 55 to 60%.  The study was inadequate for evaluation of left ventricular regional wall motion.  Left ventricular wall thickness was mildly increased.  Estimated peak pulmonary artery systolic pressure was mildly increased.   Zannie Cove [facility] Short Stay Center/Anesthesiology Phone [phone] 09/29/2019 1:16 PM

## 2019-09-29 ENCOUNTER — Other Ambulatory Visit (HOSPITAL_COMMUNITY)
Admission: RE | Admit: 2019-09-29 | Discharge: 2019-09-29 | Disposition: A | Discharge status: Home / Self Care | Payer: Commercial Managed Care - PPO | Source: Ambulatory Visit | Attending: Neurological Surgery | Admitting: Neurological Surgery

## 2019-09-29 DIAGNOSIS — Z20822 Contact with and (suspected) exposure to covid-19: Secondary | ICD-10-CM | POA: Insufficient documentation

## 2019-09-29 DIAGNOSIS — Z01812 Encounter for preprocedural laboratory examination: Secondary | ICD-10-CM | POA: Diagnosis not present

## 2019-09-29 LAB — SARS CORONAVIRUS 2 (TAT 6-24 HRS): SARS Coronavirus 2: NEGATIVE

## 2019-09-29 NOTE — Anesthesia Preprocedure Evaluation (Addendum)
Anesthesia Evaluation  Patient identified by MRN, date of birth, ID band Patient awake    Reviewed: Allergy & Precautions, NPO status , Patient's Chart, lab work & pertinent test results  Airway Mallampati: III  TM Distance: >3 FB Neck ROM: Full    Dental  (+) Teeth Intact, Dental Advisory Given, Missing   Pulmonary neg pulmonary ROS,    Pulmonary exam normal breath sounds clear to auscultation       Cardiovascular hypertension, Pt. on medications Normal cardiovascular exam Rhythm:Regular Rate:Normal     Neuro/Psych Spondylolisthesis    GI/Hepatic negative GI ROS, Neg liver ROS,   Endo/Other  Obesity   Renal/GU negative Renal ROS     Musculoskeletal negative musculoskeletal ROS (+)   Abdominal   Peds  Hematology negative hematology ROS (+)   Anesthesia Other Findings Day of surgery medications reviewed with the patient.  Reproductive/Obstetrics                           Anesthesia Physical Anesthesia Plan  ASA: III  Anesthesia Plan: General   Post-op Pain Management:    Induction: Intravenous  PONV Risk Score and Plan: 3 and Midazolam, Ondansetron, Diphenhydramine, Dexamethasone and Propofol infusion  Airway Management Planned: Oral ETT  Additional Equipment:   Intra-op Plan:   Post-operative Plan: Extubation in OR  Informed Consent: I have reviewed the patients History and Physical, chart, labs and discussed the procedure including the risks, benefits and alternatives for the proposed anesthesia with the patient or authorized representative who has indicated his/her understanding and acceptance.     Dental advisory given  Plan Discussed with: CRNA  Anesthesia Plan Comments: (Asked to review Abnormal EKG done at PAT. Tracing shows inverted T waves in Lead III, with biphasic waved in aVL. I spoke with patient, he denied any cardiac history. He says he did have a cardiac  evaluation after surgery in 2007 due to abnormal EKG but was told everything was okay. Currently, he denies any CP or SOB. His activity is limited only by his back pain. He can climb more than 2 flights of stairs with dyspnea or CP. He is treated for HTN and HLD. He is not diabetic. Never smoker. Review of records in [place] shows that in 2007 he had postoperative ventricular bigeminy and cardiology was consulted. EKG at that time also showed inverted T waves in lead III. He subsequently had echo 05/14/2006 showing EF 55 to 60%, mildly increased left ventricular wall thickness, mildly increased peak pulmonary artery systolic pressure.  The patient reports he also had a treadmill test ordered by Dr. Marlou Porch that he recalls being told was normal.  Dr. Marlou Porch was with [facility] cardiology at the time, and these records were requested, received, and reviewed. Per note 02/10/08, " I performed an exercise treadmill test today which was low risk with no evidence of ischemia.  Rare PVC.  He exercised for total of 10 minutes and 26 seconds.  His resting blood pressure was 146/100 and increased appropriately."  I reviewed the case with Dr. Glennon Mac.  She reviewed patient's EKG tracings and echocardiogram.  Given that he has good functional status and no cardiovascular symptoms, she advised that he can proceed as planned barring acute status change.  Preop labs reviewed, within normal limits.  EKG 09/27/2019: Sinus bradycardia.  Rate 55.  T wave inversion in lead III.  EKG 02/21/2006 (copy on chart): Sinus rhythm with occasional premature ventricular complexes.  Rate 90.  T wave inversion in lead III.  CHEST - 2 VIEW 09/27/19  COMPARISON:  02/21/2006  FINDINGS: The heart size and mediastinal contours are within normal limits. Both lungs are clear. The visualized skeletal structures are unremarkable.  IMPRESSION: No active cardiopulmonary disease.  TTE 05/14/2006: Summary: Overall left ventricular systolic function  was normal.  Left ventricular ejection fraction was estimated, range being 55 to 60%.  The study was inadequate for evaluation of left ventricular regional wall motion.  Left ventricular wall thickness was mildly increased.  Estimated peak pulmonary artery systolic pressure was mildly increased.)      Anesthesia Quick Evaluation

## 2019-09-30 ENCOUNTER — Observation Stay (HOSPITAL_COMMUNITY)
Admission: RE | Admit: 2019-09-30 | Discharge: 2019-10-01 | Disposition: A | Discharge status: Home / Self Care | Payer: No Typology Code available for payment source | Source: Ambulatory Visit | Attending: Neurological Surgery | Admitting: Neurological Surgery

## 2019-09-30 ENCOUNTER — Other Ambulatory Visit: Payer: Self-pay

## 2019-09-30 ENCOUNTER — Ambulatory Visit (HOSPITAL_COMMUNITY): Payer: Self-pay | Attending: Neurological Surgery

## 2019-09-30 ENCOUNTER — Ambulatory Visit (HOSPITAL_COMMUNITY): Payer: No Typology Code available for payment source | Admitting: Physician Assistant

## 2019-09-30 ENCOUNTER — Encounter (HOSPITAL_COMMUNITY)
Admission: RE | Disposition: A | Discharge status: Home / Self Care | Payer: Self-pay | Source: Ambulatory Visit | Attending: Neurological Surgery

## 2019-09-30 ENCOUNTER — Ambulatory Visit (HOSPITAL_COMMUNITY): Payer: No Typology Code available for payment source | Admitting: Certified Registered"

## 2019-09-30 ENCOUNTER — Encounter (HOSPITAL_COMMUNITY): Payer: Self-pay | Admitting: Neurological Surgery

## 2019-09-30 DIAGNOSIS — M5416 Radiculopathy, lumbar region: Secondary | ICD-10-CM | POA: Insufficient documentation

## 2019-09-30 DIAGNOSIS — E669 Obesity, unspecified: Secondary | ICD-10-CM | POA: Diagnosis not present

## 2019-09-30 DIAGNOSIS — E785 Hyperlipidemia, unspecified: Secondary | ICD-10-CM | POA: Diagnosis not present

## 2019-09-30 DIAGNOSIS — I10 Essential (primary) hypertension: Secondary | ICD-10-CM | POA: Insufficient documentation

## 2019-09-30 DIAGNOSIS — M48061 Spinal stenosis, lumbar region without neurogenic claudication: Secondary | ICD-10-CM | POA: Insufficient documentation

## 2019-09-30 DIAGNOSIS — M4316 Spondylolisthesis, lumbar region: Secondary | ICD-10-CM | POA: Insufficient documentation

## 2019-09-30 DIAGNOSIS — Z79899 Other long term (current) drug therapy: Secondary | ICD-10-CM | POA: Insufficient documentation

## 2019-09-30 DIAGNOSIS — Z8249 Family history of ischemic heart disease and other diseases of the circulatory system: Secondary | ICD-10-CM | POA: Diagnosis not present

## 2019-09-30 DIAGNOSIS — Z419 Encounter for procedure for purposes other than remedying health state, unspecified: Secondary | ICD-10-CM

## 2019-09-30 DIAGNOSIS — M4726 Other spondylosis with radiculopathy, lumbar region: Secondary | ICD-10-CM | POA: Diagnosis not present

## 2019-09-30 DIAGNOSIS — Z6838 Body mass index (BMI) 38.0-38.9, adult: Secondary | ICD-10-CM | POA: Insufficient documentation

## 2019-09-30 DIAGNOSIS — Z981 Arthrodesis status: Secondary | ICD-10-CM | POA: Diagnosis present

## 2019-09-30 SURGERY — POSTERIOR LUMBAR FUSION 1 LEVEL
Anesthesia: General | Site: Back

## 2019-09-30 MED ORDER — DEXAMETHASONE SODIUM PHOSPHATE 10 MG/ML IJ SOLN
INTRAMUSCULAR | Status: AC
  Filled 2019-09-30: qty 1

## 2019-09-30 MED ORDER — POLYETHYLENE GLYCOL 3350 17 G PO PACK: 17.0000 g | PACK | Freq: Every day | ORAL | Status: DC | PRN

## 2019-09-30 MED ORDER — VANCOMYCIN HCL 1000 MG IV SOLR
INTRAVENOUS | Status: DC | PRN
  Administered 2019-09-30: 1000 mg

## 2019-09-30 MED ORDER — MENTHOL 3 MG MT LOZG: 1.0000 | LOZENGE | OROMUCOSAL | Status: DC | PRN

## 2019-09-30 MED ORDER — BUPIVACAINE HCL (PF) 0.25 % IJ SOLN
INTRAMUSCULAR | Status: AC
  Filled 2019-09-30: qty 30

## 2019-09-30 MED ORDER — ACETAMINOPHEN 325 MG PO TABS
650.0000 mg | ORAL_TABLET | ORAL | Status: DC | PRN
  Administered 2019-09-30 – 2019-10-01 (×3): 650 mg via ORAL
  Filled 2019-09-30 (×3): qty 2

## 2019-09-30 MED ORDER — LACTATED RINGERS IV SOLN: INTRAVENOUS | Status: DC | PRN

## 2019-09-30 MED ORDER — THROMBIN 20000 UNITS EX SOLR
CUTANEOUS | Status: AC
  Filled 2019-09-30: qty 20000

## 2019-09-30 MED ORDER — LIDOCAINE 2% (20 MG/ML) 5 ML SYRINGE
INTRAMUSCULAR | Status: DC | PRN
  Administered 2019-09-30: 100 mg via INTRAVENOUS

## 2019-09-30 MED ORDER — EPHEDRINE 5 MG/ML INJ
INTRAVENOUS | Status: AC
  Filled 2019-09-30: qty 10

## 2019-09-30 MED ORDER — SODIUM CHLORIDE 0.9 % IV SOLN
250.0000 mL | INTRAVENOUS | Status: DC
  Administered 2019-09-30: 250 mL via INTRAVENOUS

## 2019-09-30 MED ORDER — DIPHENHYDRAMINE HCL 50 MG/ML IJ SOLN
INTRAMUSCULAR | Status: AC
  Filled 2019-09-30: qty 1

## 2019-09-30 MED ORDER — FENTANYL CITRATE (PF) 100 MCG/2ML IJ SOLN
25.0000 ug | INTRAMUSCULAR | Status: DC | PRN
  Administered 2019-09-30 (×3): 25 ug via INTRAVENOUS

## 2019-09-30 MED ORDER — HYDROCODONE-ACETAMINOPHEN 5-325 MG PO TABS: 1.0000 | ORAL_TABLET | ORAL | Status: DC | PRN

## 2019-09-30 MED ORDER — FENTANYL CITRATE (PF) 250 MCG/5ML IJ SOLN
INTRAMUSCULAR | Status: AC
  Filled 2019-09-30: qty 5

## 2019-09-30 MED ORDER — CELECOXIB 200 MG PO CAPS
200.0000 mg | ORAL_CAPSULE | Freq: Two times a day (BID) | ORAL | Status: DC
  Administered 2019-09-30 – 2019-10-01 (×3): 200 mg via ORAL
  Filled 2019-09-30 (×3): qty 1

## 2019-09-30 MED ORDER — DIPHENHYDRAMINE HCL 50 MG/ML IJ SOLN
INTRAMUSCULAR | Status: DC | PRN
Start: 2019-09-30 — End: 2019-09-30
  Administered 2019-09-30: 25 mg via INTRAVENOUS

## 2019-09-30 MED ORDER — PHENOL 1.4 % MT LIQD: 1.0000 | OROMUCOSAL | Status: DC | PRN

## 2019-09-30 MED ORDER — ONDANSETRON HCL 4 MG PO TABS: 4.0000 mg | ORAL_TABLET | Freq: Four times a day (QID) | ORAL | Status: DC | PRN

## 2019-09-30 MED ORDER — ACETAMINOPHEN 500 MG PO TABS
ORAL_TABLET | ORAL | Status: AC
  Administered 2019-09-30: 1000 mg via ORAL
  Filled 2019-09-30: qty 2

## 2019-09-30 MED ORDER — VANCOMYCIN HCL 1000 MG IV SOLR
INTRAVENOUS | Status: AC
  Filled 2019-09-30: qty 1000

## 2019-09-30 MED ORDER — SENNA 8.6 MG PO TABS
1.0000 | ORAL_TABLET | Freq: Two times a day (BID) | ORAL | Status: DC
  Administered 2019-09-30 – 2019-10-01 (×2): 8.6 mg via ORAL
  Filled 2019-09-30 (×2): qty 1

## 2019-09-30 MED ORDER — GLYCOPYRROLATE PF 0.2 MG/ML IJ SOSY
PREFILLED_SYRINGE | INTRAMUSCULAR | Status: DC | PRN
  Administered 2019-09-30 (×2): .1 mg via INTRAVENOUS

## 2019-09-30 MED ORDER — ONDANSETRON HCL 4 MG/2ML IJ SOLN
INTRAMUSCULAR | Status: AC
  Filled 2019-09-30: qty 2

## 2019-09-30 MED ORDER — METHOCARBAMOL 500 MG PO TABS
500.0000 mg | ORAL_TABLET | Freq: Four times a day (QID) | ORAL | Status: DC | PRN
  Administered 2019-09-30 – 2019-10-01 (×4): 500 mg via ORAL
  Filled 2019-09-30 (×4): qty 1

## 2019-09-30 MED ORDER — SUGAMMADEX SODIUM 200 MG/2ML IV SOLN
INTRAVENOUS | Status: DC | PRN
  Administered 2019-09-30: 300 mg via INTRAVENOUS

## 2019-09-30 MED ORDER — SODIUM CHLORIDE 0.9% FLUSH: 3.0000 mL | INTRAVENOUS | Status: DC | PRN

## 2019-09-30 MED ORDER — PANTOPRAZOLE SODIUM 40 MG IV SOLR: 40.0000 mg | Freq: Every day | INTRAVENOUS | Status: DC

## 2019-09-30 MED ORDER — METHOCARBAMOL 1000 MG/10ML IJ SOLN
500.0000 mg | Freq: Four times a day (QID) | INTRAVENOUS | Status: DC | PRN
  Filled 2019-09-30: qty 5

## 2019-09-30 MED ORDER — SODIUM CHLORIDE 0.9 % IV SOLN
INTRAVENOUS | Status: DC | PRN
  Administered 2019-09-30: 500 mL

## 2019-09-30 MED ORDER — ATORVASTATIN CALCIUM 10 MG PO TABS: 20.0000 mg | ORAL_TABLET | Freq: Every day | ORAL | Status: DC

## 2019-09-30 MED ORDER — MIDAZOLAM HCL 2 MG/2ML IJ SOLN
INTRAMUSCULAR | Status: AC
  Filled 2019-09-30: qty 2

## 2019-09-30 MED ORDER — LIDOCAINE 2% (20 MG/ML) 5 ML SYRINGE
INTRAMUSCULAR | Status: AC
  Filled 2019-09-30: qty 5

## 2019-09-30 MED ORDER — AMLODIPINE BESYLATE 10 MG PO TABS
10.0000 mg | ORAL_TABLET | Freq: Every day | ORAL | Status: DC
  Administered 2019-10-01: 10 mg via ORAL
  Filled 2019-09-30: qty 1
  Filled 2019-09-30: qty 2

## 2019-09-30 MED ORDER — DEXAMETHASONE SODIUM PHOSPHATE 10 MG/ML IJ SOLN
INTRAMUSCULAR | Status: DC | PRN
  Administered 2019-09-30: 10 mg via INTRAVENOUS

## 2019-09-30 MED ORDER — PANTOPRAZOLE SODIUM 40 MG PO TBEC
40.0000 mg | DELAYED_RELEASE_TABLET | Freq: Every day | ORAL | Status: DC
  Administered 2019-09-30: 40 mg via ORAL
  Filled 2019-09-30: qty 1

## 2019-09-30 MED ORDER — PROPOFOL 10 MG/ML IV BOLUS
INTRAVENOUS | Status: AC
  Filled 2019-09-30: qty 20

## 2019-09-30 MED ORDER — ACETAMINOPHEN 500 MG PO TABS: 1000.0000 mg | ORAL_TABLET | Freq: Once | ORAL | Status: AC

## 2019-09-30 MED ORDER — ONDANSETRON HCL 4 MG/2ML IJ SOLN
INTRAMUSCULAR | Status: DC | PRN
  Administered 2019-09-30: 4 mg via INTRAVENOUS

## 2019-09-30 MED ORDER — CHLORHEXIDINE GLUCONATE CLOTH 2 % EX PADS: 6.0000 | MEDICATED_PAD | Freq: Once | CUTANEOUS | Status: DC

## 2019-09-30 MED ORDER — HYDROMORPHONE HCL 1 MG/ML IJ SOLN: 1.0000 mg | INTRAMUSCULAR | Status: DC | PRN

## 2019-09-30 MED ORDER — THROMBIN 20000 UNITS EX SOLR
CUTANEOUS | Status: DC | PRN
  Administered 2019-09-30: 20 mL

## 2019-09-30 MED ORDER — CEFAZOLIN SODIUM-DEXTROSE 2-4 GM/100ML-% IV SOLN
INTRAVENOUS | Status: AC
  Filled 2019-09-30: qty 100

## 2019-09-30 MED ORDER — ROCURONIUM BROMIDE 10 MG/ML (PF) SYRINGE
PREFILLED_SYRINGE | INTRAVENOUS | Status: DC | PRN
  Administered 2019-09-30: 60 mg via INTRAVENOUS
  Administered 2019-09-30: 20 mg via INTRAVENOUS

## 2019-09-30 MED ORDER — OXYCODONE HCL 5 MG PO TABS
10.0000 mg | ORAL_TABLET | ORAL | Status: DC | PRN
  Administered 2019-09-30 – 2019-10-01 (×7): 10 mg via ORAL
  Filled 2019-09-30 (×7): qty 2

## 2019-09-30 MED ORDER — THROMBIN 5000 UNITS EX SOLR
CUTANEOUS | Status: AC
  Filled 2019-09-30: qty 5000

## 2019-09-30 MED ORDER — ROCURONIUM BROMIDE 10 MG/ML (PF) SYRINGE
PREFILLED_SYRINGE | INTRAVENOUS | Status: AC
  Filled 2019-09-30: qty 10

## 2019-09-30 MED ORDER — PHENYLEPHRINE HCL-NACL 10-0.9 MG/250ML-% IV SOLN
INTRAVENOUS | Status: DC | PRN
  Administered 2019-09-30: 60 ug/min via INTRAVENOUS

## 2019-09-30 MED ORDER — BISACODYL 5 MG PO TBEC: 5.0000 mg | DELAYED_RELEASE_TABLET | Freq: Every day | ORAL | Status: DC | PRN

## 2019-09-30 MED ORDER — CEFAZOLIN SODIUM-DEXTROSE 2-4 GM/100ML-% IV SOLN
2.0000 g | Freq: Three times a day (TID) | INTRAVENOUS | Status: AC
  Administered 2019-09-30 (×2): 2 g via INTRAVENOUS
  Filled 2019-09-30 (×2): qty 100

## 2019-09-30 MED ORDER — ACETAMINOPHEN 650 MG RE SUPP: 650.0000 mg | RECTAL | Status: DC | PRN

## 2019-09-30 MED ORDER — PHENYLEPHRINE 40 MCG/ML (10ML) SYRINGE FOR IV PUSH (FOR BLOOD PRESSURE SUPPORT)
PREFILLED_SYRINGE | INTRAVENOUS | Status: AC
  Filled 2019-09-30: qty 10

## 2019-09-30 MED ORDER — FENTANYL CITRATE (PF) 100 MCG/2ML IJ SOLN
INTRAMUSCULAR | Status: DC | PRN
  Administered 2019-09-30 (×3): 50 ug via INTRAVENOUS
  Administered 2019-09-30 (×2): 100 ug via INTRAVENOUS
  Administered 2019-09-30: 50 ug via INTRAVENOUS

## 2019-09-30 MED ORDER — CEFAZOLIN SODIUM-DEXTROSE 2-4 GM/100ML-% IV SOLN
2.0000 g | INTRAVENOUS | Status: AC
  Administered 2019-09-30: 2 g via INTRAVENOUS

## 2019-09-30 MED ORDER — EPHEDRINE SULFATE-NACL 50-0.9 MG/10ML-% IV SOSY
PREFILLED_SYRINGE | INTRAVENOUS | Status: DC | PRN
  Administered 2019-09-30: 5 mg via INTRAVENOUS

## 2019-09-30 MED ORDER — MIDAZOLAM HCL 5 MG/5ML IJ SOLN
INTRAMUSCULAR | Status: DC | PRN
  Administered 2019-09-30: 2 mg via INTRAVENOUS

## 2019-09-30 MED ORDER — DOCUSATE SODIUM 100 MG PO CAPS
100.0000 mg | ORAL_CAPSULE | Freq: Two times a day (BID) | ORAL | Status: DC
  Administered 2019-09-30 – 2019-10-01 (×3): 100 mg via ORAL
  Filled 2019-09-30 (×3): qty 1

## 2019-09-30 MED ORDER — THROMBIN 5000 UNITS EX SOLR
OROMUCOSAL | Status: DC | PRN
  Administered 2019-09-30: 5 mL

## 2019-09-30 MED ORDER — PHENYLEPHRINE 40 MCG/ML (10ML) SYRINGE FOR IV PUSH (FOR BLOOD PRESSURE SUPPORT)
PREFILLED_SYRINGE | INTRAVENOUS | Status: DC | PRN
  Administered 2019-09-30 (×3): 80 ug via INTRAVENOUS
  Administered 2019-09-30: 40 ug via INTRAVENOUS
  Administered 2019-09-30: 80 ug via INTRAVENOUS
  Administered 2019-09-30: 120 ug via INTRAVENOUS

## 2019-09-30 MED ORDER — DEXMEDETOMIDINE HCL 200 MCG/2ML IV SOLN
INTRAVENOUS | Status: DC | PRN
  Administered 2019-09-30: 20 ug via INTRAVENOUS

## 2019-09-30 MED ORDER — BUPIVACAINE HCL (PF) 0.25 % IJ SOLN
INTRAMUSCULAR | Status: DC | PRN
  Administered 2019-09-30: 5 mL

## 2019-09-30 MED ORDER — GLYCOPYRROLATE PF 0.2 MG/ML IJ SOSY
PREFILLED_SYRINGE | INTRAMUSCULAR | Status: AC
  Filled 2019-09-30: qty 1

## 2019-09-30 MED ORDER — PROMETHAZINE HCL 25 MG/ML IJ SOLN: 6.2500 mg | INTRAMUSCULAR | Status: DC | PRN

## 2019-09-30 MED ORDER — PROPOFOL 10 MG/ML IV BOLUS
INTRAVENOUS | Status: DC | PRN
  Administered 2019-09-30: 170 mg via INTRAVENOUS

## 2019-09-30 MED ORDER — DEXAMETHASONE SODIUM PHOSPHATE 10 MG/ML IJ SOLN: 10.0000 mg | Freq: Once | INTRAMUSCULAR | Status: DC

## 2019-09-30 MED ORDER — ONDANSETRON HCL 4 MG/2ML IJ SOLN: 4.0000 mg | Freq: Four times a day (QID) | INTRAMUSCULAR | Status: DC | PRN

## 2019-09-30 MED ORDER — FENTANYL CITRATE (PF) 100 MCG/2ML IJ SOLN
INTRAMUSCULAR | Status: AC
  Filled 2019-09-30: qty 2

## 2019-09-30 MED ORDER — SODIUM CHLORIDE 0.9% FLUSH
3.0000 mL | Freq: Two times a day (BID) | INTRAVENOUS | Status: DC
  Administered 2019-09-30 (×2): 3 mL via INTRAVENOUS

## 2019-09-30 MED ORDER — 0.9 % SODIUM CHLORIDE (POUR BTL) OPTIME
TOPICAL | Status: DC | PRN
  Administered 2019-09-30: 1000 mL

## 2019-09-30 SURGICAL SUPPLY — 64 items
ADH SKN CLS APL DERMABOND .7 (GAUZE/BANDAGES/DRESSINGS) ×1
APL SKNCLS STERI-STRIP NONHPOA (GAUZE/BANDAGES/DRESSINGS) ×1
BAG DECANTER FOR FLEXI CONT (MISCELLANEOUS) ×3 IMPLANT
BASKET BONE COLLECTION (BASKET) ×3 IMPLANT
BENZOIN TINCTURE PRP APPL 2/3 (GAUZE/BANDAGES/DRESSINGS) ×3 IMPLANT
BLADE CLIPPER SURG (BLADE) IMPLANT
BUR CARBIDE MATCH 3.0 (BURR) ×3 IMPLANT
CANISTER SUCT 3000ML PPV (MISCELLANEOUS) ×3 IMPLANT
CARTRIDGE OIL MAESTRO DRILL (MISCELLANEOUS) ×1 IMPLANT
CLOSURE WOUND 1/2 X4 (GAUZE/BANDAGES/DRESSINGS) ×1
CNTNR URN SCR LID CUP LEK RST (MISCELLANEOUS) ×1 IMPLANT
CONT SPEC 4OZ STRL OR WHT (MISCELLANEOUS) ×3
COVER BACK TABLE 60X90IN (DRAPES) ×3 IMPLANT
COVER WAND RF STERILE (DRAPES) ×3 IMPLANT
DERMABOND ADVANCED (GAUZE/BANDAGES/DRESSINGS) ×2
DERMABOND ADVANCED .7 DNX12 (GAUZE/BANDAGES/DRESSINGS) ×1 IMPLANT
DIFFUSER DRILL AIR PNEUMATIC (MISCELLANEOUS) ×3 IMPLANT
DRAPE C-ARM 42X72 X-RAY (DRAPES) ×6 IMPLANT
DRAPE LAPAROTOMY 100X72X124 (DRAPES) ×3 IMPLANT
DRAPE SURG 17X23 STRL (DRAPES) ×3 IMPLANT
DRSG OPSITE POSTOP 4X6 (GAUZE/BANDAGES/DRESSINGS) ×2 IMPLANT
DURAPREP 26ML APPLICATOR (WOUND CARE) ×3 IMPLANT
ELECT REM PT RETURN 9FT ADLT (ELECTROSURGICAL) ×3
ELECTRODE REM PT RTRN 9FT ADLT (ELECTROSURGICAL) ×1 IMPLANT
EVACUATOR 1/8 PVC DRAIN (DRAIN) ×3 IMPLANT
GAUZE 4X4 16PLY RFD (DISPOSABLE) IMPLANT
GLOVE BIO SURGEON STRL SZ7 (GLOVE) IMPLANT
GLOVE BIO SURGEON STRL SZ8 (GLOVE) ×6 IMPLANT
GLOVE BIOGEL PI IND STRL 7.0 (GLOVE) IMPLANT
GLOVE BIOGEL PI INDICATOR 7.0 (GLOVE)
GOWN STRL REUS W/ TWL LRG LVL3 (GOWN DISPOSABLE) IMPLANT
GOWN STRL REUS W/ TWL XL LVL3 (GOWN DISPOSABLE) ×2 IMPLANT
GOWN STRL REUS W/TWL 2XL LVL3 (GOWN DISPOSABLE) IMPLANT
GOWN STRL REUS W/TWL LRG LVL3 (GOWN DISPOSABLE)
GOWN STRL REUS W/TWL XL LVL3 (GOWN DISPOSABLE) ×6
GRAFT TRIN ELITE MED MUSC TRAN (Graft) ×2 IMPLANT
HEMOSTAT POWDER KIT SURGIFOAM (HEMOSTASIS) IMPLANT
KIT BASIN OR (CUSTOM PROCEDURE TRAY) ×3 IMPLANT
KIT BONE MRW ASP ANGEL CPRP (KITS) IMPLANT
KIT TURNOVER KIT B (KITS) ×3 IMPLANT
MILL MEDIUM DISP (BLADE) ×3 IMPLANT
NDL HYPO 25X1 1.5 SAFETY (NEEDLE) ×1 IMPLANT
NEEDLE HYPO 25X1 1.5 SAFETY (NEEDLE) ×3 IMPLANT
NS IRRIG 1000ML POUR BTL (IV SOLUTION) ×3 IMPLANT
OIL CARTRIDGE MAESTRO DRILL (MISCELLANEOUS) ×3
PACK LAMINECTOMY NEURO (CUSTOM PROCEDURE TRAY) ×3 IMPLANT
PAD ARMBOARD 7.5X6 YLW CONV (MISCELLANEOUS) ×9 IMPLANT
ROD LORD LIPPED TI 5.5X35 (Rod) ×4 IMPLANT
SCREW CORT SHANK MOD 6.5X40 (Screw) ×8 IMPLANT
SCREW POLYAXIAL TULIP (Screw) ×8 IMPLANT
SET SCREW (Screw) ×12 IMPLANT
SET SCREW SPNE (Screw) IMPLANT
SPACER IDENTITI PS 10X9X25 15D (Spacer) ×4 IMPLANT
SPONGE LAP 4X18 RFD (DISPOSABLE) IMPLANT
SPONGE SURGIFOAM ABS GEL 100 (HEMOSTASIS) ×3 IMPLANT
STRIP CLOSURE SKIN 1/2X4 (GAUZE/BANDAGES/DRESSINGS) ×3 IMPLANT
SUT VIC AB 0 CT1 18XCR BRD8 (SUTURE) ×1 IMPLANT
SUT VIC AB 0 CT1 8-18 (SUTURE) ×3
SUT VIC AB 2-0 CP2 18 (SUTURE) ×3 IMPLANT
SUT VIC AB 3-0 SH 8-18 (SUTURE) ×6 IMPLANT
TOWEL GREEN STERILE (TOWEL DISPOSABLE) ×3 IMPLANT
TOWEL GREEN STERILE FF (TOWEL DISPOSABLE) ×3 IMPLANT
TRAY FOLEY MTR SLVR 16FR STAT (SET/KITS/TRAYS/PACK) ×3 IMPLANT
WATER STERILE IRR 1000ML POUR (IV SOLUTION) ×3 IMPLANT

## 2019-09-30 NOTE — Anesthesia Procedure Notes (Signed)
Procedure Name: Intubation Date/Time: 09/30/2019 7:33 AM Performed by: Orlie Dakin, CRNA Pre-anesthesia Checklist: Patient identified, Suction available, Emergency Drugs available and Patient being monitored Patient Re-evaluated:Patient Re-evaluated prior to induction Oxygen Delivery Method: Circle system utilized Preoxygenation: Pre-oxygenation with 100% oxygen Induction Type: IV induction Ventilation: Mask ventilation without difficulty and Oral airway inserted - appropriate to patient size Laryngoscope Size: Mac and 4 Grade View: Grade II Tube type: Oral Tube size: 7.5 mm Number of attempts: 1 Airway Equipment and Method: Stylet Placement Confirmation: ETT inserted through vocal cords under direct vision,  positive ETCO2 and breath sounds checked- equal and bilateral Secured at: 23 cm Tube secured with: Tape Dental Injury: Teeth and Oropharynx as per pre-operative assessment  Comments: Large floppy epiglottis noted on DL. 4x4s bite block used.

## 2019-09-30 NOTE — H&P (Signed)
Subjective: Patient is a 49 y.o. male admitted for back and leg pain. Onset of symptoms was several months ago, gradually worsening since that time.  The pain is rated severe, and is located at the across the lower back and radiates to LLE. The pain is described as aching and occurs all day. The symptoms have been progressive. Symptoms are exacerbated by exercise. 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 H/O colonoscopy   . Hyperlipemia   . Hypertension     Past Surgical History:  Procedure Laterality Date  . RECTAL SURGERY      Prior to Admission medications   Medication Sig Start Date End Date Taking? Authorizing Provider  amLODipine (NORVASC) 10 MG tablet Take 10 mg by mouth daily. 08/13/19  Yes [provider]  atorvastatin (LIPITOR) 20 MG tablet Take 20 mg by mouth daily. 08/13/19  Yes [provider]   No Known Allergies  Social History   Tobacco Use  . Smoking status: Never Smoker  . Smokeless tobacco: Never Used  Substance Use Topics  . Alcohol use: No    Family History  Problem Relation Age of Onset  . Hypertension Father      Review of Systems  Positive ROS: neg  All other systems have been reviewed and were otherwise negative with the exception of those mentioned in the HPI and as above.  Objective: Vital signs in last 24 hours: Temp:  [96.8 F (36 C)] 96.8 F (36 C) (05/07 0600) Pulse Rate:  [56] 56 (05/07 0600) Resp:  [18] 18 (05/07 0600) BP: (125)/(80) 125/80 (05/07 0600) SpO2:  [95 %] 95 % (05/07 0600) Weight:  [111.1 kg] 111.1 kg (05/07 0600)  General Appearance: Alert, cooperative, no distress, appears stated age Head: Normocephalic, without obvious abnormality, atraumatic Eyes: PERRL, conjunctiva/corneas clear, EOM's intact    Neck: Supple, symmetrical, trachea midline Back: Symmetric, no curvature, ROM normal, no CVA tenderness Lungs:  respirations  unlabored Heart: Regular rate and rhythm Abdomen: Soft, non-tender Extremities: Extremities normal, atraumatic, no cyanosis or edema Pulses: 2+ and symmetric all extremities Skin: Skin color, texture, turgor normal, no rashes or lesions  NEUROLOGIC:   Mental status: Alert and oriented x4,  no aphasia, good attention span, fund of knowledge, and memory Motor Exam - grossly normal Sensory Exam - grossly normal Reflexes: 1+ Coordination - grossly normal Gait - grossly normal Balance - grossly normal Cranial Nerves: I: smell Not tested  II: visual acuity  OS: nl    OD: nl  II: visual fields Full to confrontation  II: pupils Equal, round, reactive to light  III,VII: ptosis None  III,IV,VI: extraocular muscles  Full ROM  V: mastication Normal  V: facial light touch sensation  Normal  V,VII: corneal reflex  Present  VII: facial muscle function - upper  Normal  VII: facial muscle function - lower Normal  VIII: hearing Not tested  IX: soft palate elevation  Normal  IX,X: gag reflex Present  XI: trapezius strength  5/5  XI: sternocleidomastoid strength 5/5  XI: neck flexion strength  5/5  XII: tongue strength  Normal    Data Review Lab Results  Component Value Date   WBC 4.5 09/27/2019   HGB 13.8 09/27/2019   HCT 41.2 09/27/2019   MCV 88.4 09/27/2019   PLT 254 09/27/2019   Lab Results  Component Value Date   NA 140 09/27/2019   K 4.0 09/27/2019  CL 109 09/27/2019   CO2 25 09/27/2019   BUN 13 09/27/2019   CREATININE 1.02 09/27/2019   GLUCOSE 90 09/27/2019   Lab Results  Component Value Date   INR 1.0 09/27/2019    Assessment/Plan:  Estimated body mass index is 38.37 kg/m as calculated from the following:   Height as of this encounter: 5\' 7"  (1.702 m).   Weight as of this encounter: 111.1 kg. Patient admitted for PLIF L4-5. Patient has failed a reasonable attempt at conservative therapy.  I explained the condition and procedure to the patient and answered any  questions.  Patient wishes to proceed with procedure as planned. Understands risks/ benefits and typical outcomes of procedure.   Eustace Moore 09/30/2019 7:17 AM

## 2019-09-30 NOTE — Evaluation (Signed)
Occupational Therapy Evaluation Patient Details Name: Jerry Crane MRN: [id number] DOB: [birth year] Today's Date: 09/30/2019    History of Present Illness Decompressive lumbar laminectomy, hemifacetectomy foraminotomies, and Posterior lumbar interbody fusion  L4-5   Clinical Impression   This 49 yo male admitted and underwent above presents to acute OT with decreased mobility due to pain, back precautions affecting how he mobilizes now and needs to do basic ADLs. He will benefit from acute OT without need for follow up.    Follow Up Recommendations  No OT follow up;Supervision - Intermittent    Equipment Recommendations  3 in 1 bedside commode(wide; tub bench; reacher, sock aid (wide), long sponge, long shoe horn)       Precautions / Restrictions Precautions Precautions: Back Precaution Booklet Issued: Yes (comment) Required Braces or Orthoses: Spinal Brace Spinal Brace: Lumbar corset;Applied in sitting position Restrictions Weight Bearing Restrictions: No      Mobility Bed Mobility Overal bed mobility: Needs Assistance Bed Mobility: Rolling;Sidelying to Sit;Sit to Sidelying Rolling: Min guard(use of rail and sequencing cues) Sidelying to sit: Mod assist(trunk)     Sit to sidelying: Mod assist(legs)    Transfers Overall transfer level: Needs assistance Equipment used: None Transfers: Sit to/from Stand Sit to Stand: Min guard              Balance Overall balance assessment: Mild deficits observed, not formally tested                                         ADL either performed or assessed with clinical judgement   ADL Overall ADL's : Needs assistance/impaired Eating/Feeding: Independent;Sitting   Grooming: Set up;Sitting   Upper Body Bathing: Set up;Sitting   Lower Body Bathing: Maximal assistance Lower Body Bathing Details (indicate cue type and reason): min guard A sit<>stand from raised bed Upper Body Dressing : Set  up;Sitting   Lower Body Dressing: Total assistance Lower Body Dressing Details (indicate cue type and reason): min guard A sit<>stand from raised bed Toilet Transfer: Min guard;Ambulation Toilet Transfer Details (indicate cue type and reason): No AD Toileting- Clothing Manipulation and Hygiene: Min guard;Sit to/from stand               Vision Patient Visual Report: No change from baseline              Pertinent Vitals/Pain Pain Assessment: 0-10 Pain Score: 8  Pain Location: incisional Pain Descriptors / Indicators: Aching;Grimacing;Operative site guarding;Sore Pain Intervention(s): Limited activity within patient's tolerance;Monitored during session;Premedicated before session     Hand Dominance Right   Extremity/Trunk Assessment Upper Extremity Assessment Upper Extremity Assessment: Overall WFL for tasks assessed           Communication Communication Communication: No difficulties   Cognition Arousal/Alertness: Awake/alert Behavior During Therapy: WFL for tasks assessed/performed Overall Cognitive Status: Within Functional Limits for tasks assessed                                                Home Living Family/patient expects to be discharged to:: Private residence Living Arrangements: Spouse/significant other;Children Available Help at Discharge: Family Type of Home: House Home Access: Stairs to enter Technical brewer of Steps: 4 Entrance Stairs-Rails: Right Home Layout: Two level;Bed/bath upstairs Alternate Level Stairs-Number of  Steps: 7 landing 5 Alternate Level Stairs-Rails: Right;Left;Can reach both Bathroom Shower/Tub: Tub/shower unit;Curtain   Bathroom Toilet: Standard     Home Equipment: None          Prior Functioning/Environment Level of Independence: Independent        Comments: works as a Tax adviser for [employer] Problem List: Decreased strength;Decreased range of motion;Impaired balance  (sitting and/or standing);Pain;Decreased knowledge of use of DME or AE;Decreased knowledge of precautions      OT Treatment/Interventions: Self-care/ADL training;DME and/or AE instruction;Patient/family education;Balance training    OT Goals(Current goals can be found in the care plan section) Acute Rehab OT Goals Patient Stated Goal: to get pain better, but happy original pain in legs is gone OT Goal Formulation: With patient Time For Goal Achievement: 10/14/19 Potential to Achieve Goals: Good  OT Frequency: Min 2X/week              AM-PAC OT "6 Clicks" Daily Activity     Outcome Measure Help from another person eating meals?: None Help from another person taking care of personal grooming?: A Little Help from another person toileting, which includes using toliet, bedpan, or urinal?: A Little Help from another person bathing (including washing, rinsing, drying)?: A Lot Help from another person to put on and taking off regular upper body clothing?: A Little Help from another person to put on and taking off regular lower body clothing?: A Lot 6 Click Score: 17   End of Session Equipment Utilized During Treatment: Gait belt;Back brace Nurse Communication: (RN saw pt ambulating in hallway with me)  Activity Tolerance: Patient tolerated treatment well Patient left: in bed;with call bell/phone within reach;with family/visitor present  OT Visit Diagnosis: Unsteadiness on feet (R26.81);Other abnormalities of gait and mobility (R26.89);Muscle weakness (generalized) (M62.81);Pain Pain - part of body: (incisonal)                Time: 1601-0932 OT Time Calculation (min): 35 min Charges:  OT General Charges $OT Visit: 1 Visit OT Evaluation $OT Eval Moderate Complexity: 1 Mod OT Treatments $Self Care/Home Management : 8-22 mins  [name], OTR/L Acute [employer] Pager [phone] Office [phone]    [name] 09/30/2019, 6:31 PM

## 2019-09-30 NOTE — Op Note (Signed)
09/30/2019  10:22 AM  PATIENT:  Jerry Crane  49 y.o. male  PRE-OPERATIVE DIAGNOSIS: Spondylolisthesis L4-5, severe spinal stenosis L4-5, back and left leg pain  POST-OPERATIVE DIAGNOSIS:  same  PROCEDURE:   1. Decompressive lumbar laminectomy, hemifacetectomy and foraminotomies L4-5 requiring more work than would be required for a simple exposure of the disk for PLIF in order to adequately decompress the neural elements and address the spinal stenosis 2. Posterior lumbar interbody fusion L4-5 using porous titanium interbody cages packed with morcellized allograft and autograft  3. Posterior fixation L4-5 using Alphatec cortical pedicle screws.  4. Intertransverse arthrodesis L4-5 using morcellized autograft and allograft.  SURGEON:  Marikay Alar, MD  ASSISTANTS: Hoyt Koch, MD  ANESTHESIA:  General  EBL: Less than 200 ml  Total I/O In: 1100 [I.V.:1000; IV Piggyback:100] Out: 560 [Urine:360; Blood:200]  BLOOD ADMINISTERED:none  DRAINS: none   INDICATION FOR PROCEDURE: This patient presented with severe back and left leg pain after work-related incident. Imaging revealed spondylolisthesis with severe spinal stenosis L4-5. The patient tried a reasonable attempt at conservative medical measures without relief. I recommended decompression and instrumented fusion to address the stenosis as well as the segmental  instability.  Patient understood the risks, benefits, and alternatives and potential outcomes and wished to proceed.  PROCEDURE DETAILS:  The patient was brought to the operating room. After induction of generalized endotracheal anesthesia the patient was rolled into the prone position on chest rolls and all pressure points were padded. The patient's lumbar region was cleaned and then prepped with DuraPrep and draped in the usual sterile fashion. Anesthesia was injected and then a dorsal midline incision was made and carried down to the lumbosacral fascia. The fascia  was opened and the paraspinous musculature was taken down in a subperiosteal fashion to expose L4-5. A self-retaining retractor was placed. Intraoperative fluoroscopy confirmed my level, and I started with placement of the L4 cortical pedicle screws. The pedicle screw entry zones were identified utilizing surface landmarks and  AP and lateral fluoroscopy. I scored the cortex with the high-speed drill and then used the hand drill to drill an upward and outward direction into the pedicle. I then tapped line to line. I then placed a 6.5 x 40 mm cortical pedicle screw into the pedicles of L4 bilaterally.   I then turned my attention to the decompression and complete lumbar laminectomies, hemi- facetectomies, and foraminotomies were performed at L4-5.  My nurse practitioner was directly involved in the decompression and exposure of the neural elements. the patient had significant spinal stenosis and this required more work than would be required for a simple exposure of the disc for posterior lumbar interbody fusion which would only require a limited laminotomy. Much more generous decompression and generous foraminotomy was undertaken in order to adequately decompress the neural elements and address the patient's leg pain. The yellow ligament was removed to expose the underlying dura and nerve roots, and generous foraminotomies were performed to adequately decompress the neural elements. Both the exiting and traversing nerve roots were decompressed on both sides until a coronary dilator passed easily along the nerve roots. Once the decompression was complete, I turned my attention to the posterior lower lumbar interbody fusion. The epidural venous vasculature was coagulated and cut sharply.  There was a large broad-based disc herniation contributing to the severe stenosis.  Disc space was incised and the initial discectomy was performed with pituitary rongeurs. The disc space was distracted with sequential distractors to  a height of  10 mm. We then used a series of scrapers and shavers to prepare the endplates for fusion. The midline was prepared with Epstein curettes. Once the complete discectomy was finished, we packed an appropriate sized interbody cage with local autograft and morcellized allograft, gently retracted the nerve root, and tapped the cage into position at L4-5.  The midline between the cages was packed with morselized autograft and allograft. We then turned our attention to the placement of the lower pedicle screws. The pedicle screw entry zones were identified utilizing surface landmarks and fluoroscopy. I drilled into each pedicle utilizing the hand drill, and tapped each pedicle with the appropriate tap. We palpated with a ball probe to assure no break in the cortex. We then placed 6.5 x 40 mm cortical pedicle screws into the pedicles bilaterally at L5.  My partner assisted in placement of the pedicle screws.  We then decorticated the transverse processes and laid a mixture of morcellized autograft and allograft out over these to perform intertransverse arthrodesis at L4-5. We then placed lordotic rods into the multiaxial screw heads of the pedicle screws and locked these in position with the locking caps and anti-torque device. We then checked our construct with AP and lateral fluoroscopy. Irrigated with copious amounts of bacitracin-containing saline solution. Inspected the nerve roots once again to assure adequate decompression, lined to the dura with Gelfoam, placed powdered vancomycin into the wound, and then we closed the muscle and the fascia with 0 Vicryl. Closed the subcutaneous tissues with 2-0 Vicryl and subcuticular tissues with 3-0 Vicryl. The skin was closed with benzoin and Steri-Strips. Dressing was then applied, the patient was awakened from general anesthesia and transported to the recovery room in stable condition. At the end of the procedure all sponge, needle and instrument counts were  correct.   PLAN OF CARE: admit to inpatient  PATIENT DISPOSITION:  PACU - hemodynamically stable.   Delay start of Pharmacological VTE agent (>24hrs) due to surgical blood loss or risk of bleeding:  yes

## 2019-09-30 NOTE — Transfer of Care (Signed)
Immediate Anesthesia Transfer of Care Note  Patient: Jerry Crane  Procedure(s) Performed: Posterior Lumbar Interbody Fusion - Lumbar Four-Lumbar Five (N/A Back)  Patient Location: PACU  Anesthesia Type:General  Level of Consciousness: drowsy  Airway & Oxygen Therapy: Patient Spontanous Breathing and Patient connected to face mask oxygen  Post-op Assessment: Report given to RN, Post -op Vital signs reviewed and stable and Patient moving all extremities X 4  Post vital signs: Reviewed and stable  Last Vitals:  Vitals Value Taken Time  BP    Temp    Pulse    Resp    SpO2      Last Pain:  Vitals:   09/30/19 0612  TempSrc:   PainSc: 7       Patients Stated Pain Goal: 3 (09/30/19 0612)  Complications: No apparent anesthesia complications

## 2019-10-01 DIAGNOSIS — M4726 Other spondylosis with radiculopathy, lumbar region: Secondary | ICD-10-CM | POA: Diagnosis not present

## 2019-10-01 MED ORDER — METHOCARBAMOL 500 MG PO TABS: 500.0000 mg | ORAL_TABLET | Freq: Four times a day (QID) | ORAL | 3 refills | Status: DC | PRN

## 2019-10-01 MED ORDER — OXYCODONE HCL 10 MG PO TABS: 10.0000 mg | ORAL_TABLET | ORAL | 0 refills | Status: DC | PRN

## 2019-10-01 NOTE — Progress Notes (Signed)
Occupational Therapy Treatment and Discharge Patient Details Name: Jerry Crane MRN: [id number] DOB: 1970-07-26 Today's Date: 10/01/2019    History of present illness Decompressive lumbar laminectomy, hemifacetectomy foraminotomies, and Posterior lumbar interbody fusion  L4-5   OT comments  This 49 yo male seen today to go over LBD ADLs with AE, toilet transfers, and tub transfers. All education completed, we will D/C from acute OT.  Follow Up Recommendations  No OT follow up;Supervision - Intermittent    Equipment Recommendations  Other (comment)(reacher, wide sock aid, extra long shoe horn, extra long handled sponge)       Precautions / Restrictions Precautions Precautions: Back Precaution Booklet Issued: Yes (comment) Required Braces or Orthoses: Spinal Brace Spinal Brace: Lumbar corset;Applied in sitting position Restrictions Weight Bearing Restrictions: No       Mobility Bed Mobility Overal bed mobility: Needs Assistance Bed Mobility: Rolling;Sidelying to Sit Rolling: Supervision Sidelying to sit: Min assist       General bed mobility comments: pt started to just sit up--had to cue him to stop and think about what he was doing then he did it correctly  Transfers Overall transfer level: Needs assistance Equipment used: None Transfers: Sit to/from Stand Sit to Stand: Modified independent (Device/Increase time)              Balance Overall balance assessment: Mild deficits observed, not formally tested                                         ADL either performed or assessed with clinical judgement   ADL Overall ADL's : Needs assistance/impaired                     Lower Body Dressing: Modified independent;Sit to/from stand;Adhering to back precautions;With adaptive equipment Lower Body Dressing Details (indicate cue type and reason): reacher and sock aid Toilet Transfer: Modified Independent;Ambulation;Comfort height  toilet;Grab bars   Toileting- Clothing Manipulation and Hygiene: Modified independent;Sit to/from stand   Tub/ Banker: Tub transfer;Modified independent Clinical cytogeneticist Details (indicate cue type and reason): side step into tub (simulated over trashcan)         Vision Baseline Vision/History: No visual deficits Patient Visual Report: No change from baseline            Cognition Arousal/Alertness: Awake/alert Behavior During Therapy: WFL for tasks assessed/performed Overall Cognitive Status: Within Functional Limits for tasks assessed                                                     Pertinent Vitals/ Pain       Pain Assessment: Faces Faces Pain Scale: Hurts a little bit Pain Location: incisional Pain Descriptors / Indicators: Sore Pain Intervention(s): Limited activity within patient's tolerance;Monitored during session         Frequency  Min 2X/week        Progress Toward Goals  OT Goals(current goals can now be found in the care plan section)  Progress towards OT goals: Goals met/education completed, patient discharged from OT  Acute Rehab OT Goals Patient Stated Goal: to go home today OT Goal Formulation: With patient Time For Goal Achievement: 10/14/19 Potential to Achieve Goals: Good  Plan Discharge plan remains appropriate  AM-PAC OT "6 Clicks" Daily Activity     Outcome Measure   Help from another person eating meals?: None Help from another person taking care of personal grooming?: None Help from another person toileting, which includes using toliet, bedpan, or urinal?: None Help from another person bathing (including washing, rinsing, drying)?: A Little Help from another person to put on and taking off regular upper body clothing?: None Help from another person to put on and taking off regular lower body clothing?: None 6 Click Score: 23    End of Session Equipment Utilized During Treatment: Back  brace  OT Visit Diagnosis: Unsteadiness on feet (R26.81);Other abnormalities of gait and mobility (R26.89);Muscle weakness (generalized) (M62.81);Pain Pain - part of body: (incisional)   Activity Tolerance Patient tolerated treatment well   Patient Left in chair;with call bell/phone within reach   Nurse Communication (ready to go from OT standpoint, c/o dizzines but BP in normal range)        Time: 9097-5295 OT Time Calculation (min): 26 min  Charges: OT General Charges $OT Visit: 1 Visit OT Treatments $Self Care/Home Management : 23-37 mins  Golden Circle, OTR/L Acute [employer] Pager [phone] Office [phone]      Almon Register 10/01/2019, 8:20 AM

## 2019-10-01 NOTE — Progress Notes (Signed)
Patient ID: Jerry Crane, male   DOB: Oct 08, 1970, 49 y.o.   MRN: [id number] Vital signs are stable motor function is intact dressing is clean and dry patient is ambulatory discharge home today

## 2019-10-01 NOTE — Progress Notes (Signed)
Physical Therapy Treatment Patient Details Name: Jerry Crane MRN: [id number] DOB: [birth year] Today's Date: 10/01/2019    History of Present Illness Pt is a 49 y/o male s/p PLIF L4-L5. PMH including but not limited to HTN and HLD.    PT Comments    Pt presented sitting upright in recliner chair, awake and willing to participate in therapy session. Prior to admission, pt reported that he was independent with all functional mobility and ADLs. Pt lives with his wife in a two level house with several steps to enter. At the time of evaluation, pt overall at a supervision level for all functional mobility without the use of an AD. Pt ambulated in hallway and participated in stair training without difficulties. PT provided pt education re: back precautions, car transfers and a generalized walking program for pt to initiate upon d/c home. No further acute PT needs identified at this time. PT signing off.    Follow Up Recommendations  No PT follow up     Equipment Recommendations  None recommended by PT    Recommendations for Other Services       Precautions / Restrictions Precautions Precautions: Back Precaution Booklet Issued: Yes (comment) Precaution Comments: reviewed 3/3 back precautions with pt throughout; good demonstration of log roll technique Required Braces or Orthoses: Spinal Brace Spinal Brace: Lumbar corset;Applied in sitting position Restrictions Weight Bearing Restrictions: No    Mobility  Bed Mobility Overal bed mobility: Needs Assistance Bed Mobility: Rolling;Sit to Sidelying Rolling: Supervision Sit to sidelying: Supervision General bed mobility comments: utilized appropriate log roll technique  Transfers Overall transfer level: Needs assistance Equipment used: None Transfers: Sit to/from Stand Sit to Stand: Supervision         General transfer comment: for safety from recliner chair  Ambulation/Gait Ambulation/Gait assistance: Supervision Gait  Distance (Feet): 300 Feet Assistive device: None Gait Pattern/deviations: WFL(Within Functional Limits) Gait velocity: decreased   General Gait Details: no instability or LOB   Stairs Stairs: Yes Stairs assistance: Supervision Stair Management: One rail Left;Alternating pattern;Forwards Number of Stairs: 10 General stair comments: no instability or LOB   Wheelchair Mobility    Modified Rankin (Stroke Patients Only)       Balance Overall balance assessment: No apparent balance deficits (not formally assessed)                                          Cognition Arousal/Alertness: Awake/alert Behavior During Therapy: WFL for tasks assessed/performed Overall Cognitive Status: Within Functional Limits for tasks assessed                                        Exercises      General Comments        Pertinent Vitals/Pain Pain Assessment: No/denies pain Faces Pain Scale: Hurts a little bit Pain Location: incisional Pain Descriptors / Indicators: Sore Pain Intervention(s): Limited activity within patient's tolerance;Monitored during session    Home Living Family/patient expects to be discharged to:: Private residence Living Arrangements: Spouse/significant other;Children Available Help at Discharge: Family Type of Home: House Home Access: Stairs to enter Entrance Stairs-Rails: Right Home Layout: Two level;Bed/bath upstairs Home Equipment: None      Prior Function Level of Independence: Independent      Comments: works as a Biochemist, clinical for [employer]  PT Goals (current goals can now be found in the care plan section) Acute Rehab PT Goals Patient Stated Goal: to go home today PT Goal Formulation: All assessment and education complete, DC therapy    Frequency           PT Plan      Co-evaluation              AM-PAC PT "6 Clicks" Mobility   Outcome Measure  Help needed turning from your back to your side while in a  flat bed without using bedrails?: None Help needed moving from lying on your back to sitting on the side of a flat bed without using bedrails?: None Help needed moving to and from a bed to a chair (including a wheelchair)?: None Help needed standing up from a chair using your arms (e.g., wheelchair or bedside chair)?: None Help needed to walk in hospital room?: None Help needed climbing 3-5 steps with a railing? : None 6 Click Score: 24    End of Session Equipment Utilized During Treatment: Back brace Activity Tolerance: Patient tolerated treatment well Patient left: in bed;with call bell/phone within reach Nurse Communication: Mobility status PT Visit Diagnosis: Other abnormalities of gait and mobility (R26.89)     Time: 5093-2671 PT Time Calculation (min) (ACUTE ONLY): 12 min  Charges:                        Arletta Bale, DPT  Acute Rehabilitation Services Pager [phone] Office [phone]     Jerry Crane Jerry Crane 10/01/2019, 10:52 AM

## 2019-10-01 NOTE — Anesthesia Postprocedure Evaluation (Signed)
Anesthesia Post Note  Patient: Jerry Crane  Procedure(s) Performed: Posterior Lumbar Interbody Fusion - Lumbar Four-Lumbar Five (N/A Back)     Patient location during evaluation: PACU Anesthesia Type: General Level of consciousness: awake and alert Pain management: pain level controlled Vital Signs Assessment: post-procedure vital signs reviewed and stable Respiratory status: spontaneous breathing, nonlabored ventilation, respiratory function stable and patient connected to nasal cannula oxygen Cardiovascular status: blood pressure returned to baseline and stable Postop Assessment: no apparent nausea or vomiting Anesthetic complications: no    Last Vitals:  Vitals:   10/01/19 0736 10/01/19 0814  BP: 122/61 124/76  Pulse: (!) 50   Resp: 18   Temp: 36.9 C   SpO2: 98%     Last Pain:  Vitals:   10/01/19 1124  TempSrc:   PainSc: 4                  Cecile Hearing

## 2019-10-01 NOTE — Discharge Summary (Signed)
Physician Discharge Summary  Patient ID: Jerry Crane MRN: [id number] DOB/AGE: Jun 30, 1970 49 y.o.  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  Consults: None  Significant Diagnostic Studies: None  Treatments: surgery: Lumbar decompression L4-L5 posterior body arthrodesis pedicle screw fixation L4-L5  Discharge Exam: Blood pressure 124/76, pulse (!) 50, temperature 98.4 F (36.9 C), temperature source Oral, resp. rate 18, height 5\' 7"  (1.702 m), weight 111.1 kg, SpO2 98 %. Incision is clean and dry motor function is intact  Disposition: Discharge disposition: 01-Home or Self Care       Discharge Instructions    Call MD for:  redness, tenderness, or signs of infection (pain, swelling, redness, odor or green/yellow discharge around incision site)   Complete by: As directed    Call MD for:  severe uncontrolled pain   Complete by: As directed    Call MD for:  temperature >100.4   Complete by: As directed    Diet - low sodium heart healthy   Complete by: As directed    Incentive spirometry RT   Complete by: As directed    Increase activity slowly   Complete by: As directed      Allergies as of 10/01/2019   No Known Allergies     Medication List    TAKE these medications   amLODipine 10 MG tablet Commonly known as: NORVASC Take 10 mg by mouth daily.   atorvastatin 20 MG tablet Commonly known as: LIPITOR Take 20 mg by mouth daily.   methocarbamol 500 MG tablet Commonly known as: ROBAXIN Take 1 tablet (500 mg total) by mouth every 6 (six) hours as needed for muscle spasms.   Oxycodone HCl 10  MG Tabs Take 1 tablet (10 mg total) by mouth every 3 (three) hours as needed for severe pain ((score 7 to 10)).        Signed: 12/01/2019 Jodine Muchmore 10/01/2019, 9:42 AM

## 2019-10-01 NOTE — Progress Notes (Signed)
Patient alert and oriented, mae's well, voiding adequate amount of urine, swallowing without difficulty,  C/o soreness at time of discharge and medication already given. Patient discharged home with family. Script sent to pharmacy, and discharged instructions given to patient. Patient and family stated understanding of instructions given. Patient has an appointment with Dr. Yetta Barre in two weeks.

## 2019-10-01 NOTE — Discharge Instructions (Signed)

## 2021-01-10 ENCOUNTER — Other Ambulatory Visit: Payer: Self-pay | Admitting: Neurological Surgery

## 2021-01-10 DIAGNOSIS — M5416 Radiculopathy, lumbar region: Secondary | ICD-10-CM

## 2021-01-31 ENCOUNTER — Ambulatory Visit
Admission: RE | Admit: 2021-01-31 | Discharge: 2021-01-31 | Disposition: A | Discharge status: Home / Self Care | Payer: Managed Care, Other (non HMO) | Source: Ambulatory Visit | Attending: Neurological Surgery | Admitting: Neurological Surgery

## 2021-01-31 ENCOUNTER — Other Ambulatory Visit: Payer: Self-pay

## 2021-01-31 DIAGNOSIS — M5416 Radiculopathy, lumbar region: Secondary | ICD-10-CM

## 2022-10-30 ENCOUNTER — Ambulatory Visit (INDEPENDENT_AMBULATORY_CARE_PROVIDER_SITE_OTHER): Payer: Worker's Compensation

## 2022-10-30 ENCOUNTER — Encounter (HOSPITAL_COMMUNITY): Payer: Self-pay

## 2022-10-30 ENCOUNTER — Ambulatory Visit (HOSPITAL_COMMUNITY)
Admission: EM | Admit: 2022-10-30 | Discharge: 2022-10-30 | Disposition: A | Discharge status: Home / Self Care | Payer: Worker's Compensation | Attending: Emergency Medicine | Admitting: Emergency Medicine

## 2022-10-30 DIAGNOSIS — M62830 Muscle spasm of back: Secondary | ICD-10-CM

## 2022-10-30 DIAGNOSIS — M5442 Lumbago with sciatica, left side: Secondary | ICD-10-CM | POA: Diagnosis not present

## 2022-10-30 DIAGNOSIS — S161XXA Strain of muscle, fascia and tendon at neck level, initial encounter: Secondary | ICD-10-CM

## 2022-10-30 DIAGNOSIS — S39012A Strain of muscle, fascia and tendon of lower back, initial encounter: Secondary | ICD-10-CM

## 2022-10-30 MED ORDER — TIZANIDINE HCL 4 MG PO TABS: 4.0000 mg | ORAL_TABLET | Freq: Three times a day (TID) | ORAL | 0 refills | Status: AC | PRN

## 2022-10-30 MED ORDER — PREDNISONE 10 MG (21) PO TBPK: ORAL_TABLET | ORAL | 0 refills | Status: AC

## 2022-10-30 MED ORDER — IBUPROFEN 800 MG PO TABS
800.0000 mg | ORAL_TABLET | Freq: Once | ORAL | Status: AC
  Administered 2022-10-30: 800 mg via ORAL

## 2022-10-30 MED ORDER — ACETAMINOPHEN 325 MG PO TABS
ORAL_TABLET | ORAL | Status: AC
  Filled 2022-10-30: qty 3

## 2022-10-30 MED ORDER — NAPROXEN 500 MG PO TABS: 500.0000 mg | ORAL_TABLET | Freq: Two times a day (BID) | ORAL | 0 refills | Status: AC

## 2022-10-30 MED ORDER — ACETAMINOPHEN 325 MG PO TABS
975.0000 mg | ORAL_TABLET | Freq: Once | ORAL | Status: AC
  Administered 2022-10-30: 975 mg via ORAL

## 2022-10-30 MED ORDER — IBUPROFEN 800 MG PO TABS
ORAL_TABLET | ORAL | Status: AC
  Filled 2022-10-30: qty 1
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
## 2022-10-30 NOTE — Discharge Instructions (Addendum)
Your x-rays did not show any acute fracture or dislocation.  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  The hardware in your lower back appears stable.  Take the Naprosyn with 1000 mg of Tylenol twice a day as needed for pain.  Take the Zanaflex for muscle spasms, and the prednisone for pain and swelling.  Ice or heat to your neck and back, whichever feels better. People tend to feel worse over the next several days, but most people are back to normal in 1 week. A small number of people will have persistent pain for up to six weeks.   Some people may require physical therapy. Early range of motion neck exercises has been shown to speed recovery. Start doing them as soon as possible. Start doing small range and amplitude movements of your neck, first in one direction, then the other. Repeat this 10 times in each direction every hour while awake. Do these to the maximum comfortable range. You may do this sitting up or lying down.  Go to www.goodrx.com  or www.costplusdrugs.com to look up your medications. This will give you a list of where you can find your prescriptions at the most affordable prices. Or ask the pharmacist what the cash price is, or if they have any other discount programs available to help make your medication more affordable. This can be less expensive than what you would pay with insurance.

## 2022-12-17 IMAGING — CT CT L SPINE W/O CM
1 of 8 series · 5 of 14 positions shown, 7 images · non-contrast
Comparison: Radiograph 09/30/2019

CLINICAL DATA: Lower back pain

EXAM:
CT LUMBAR SPINE WITHOUT CONTRAST
TECHNIQUE: Multidetector CT imaging of the lumbar spine was performed without
intravenous contrast administration. Multiplanar CT image
reconstructions were also generated.

[Series 3: l spine soft · axial · 0.38mm/px · z∈[-289,-137]mm · 5 of 115 slices shown, 7 images]
[im 20/115  soft-tissue]
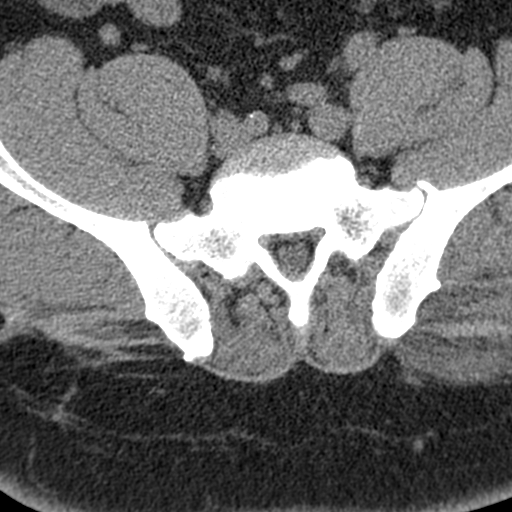
[im 20/115  bone]
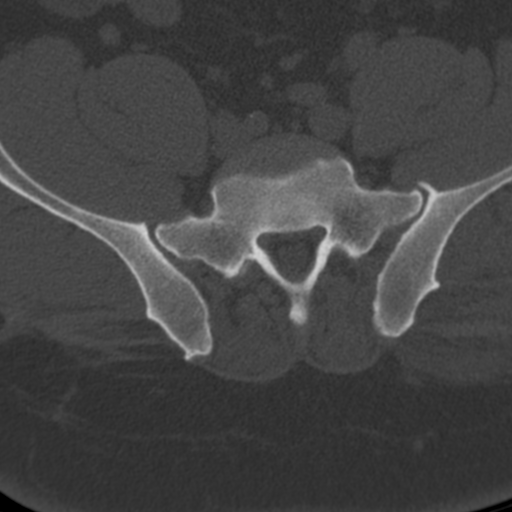
[im 39/115  bone]
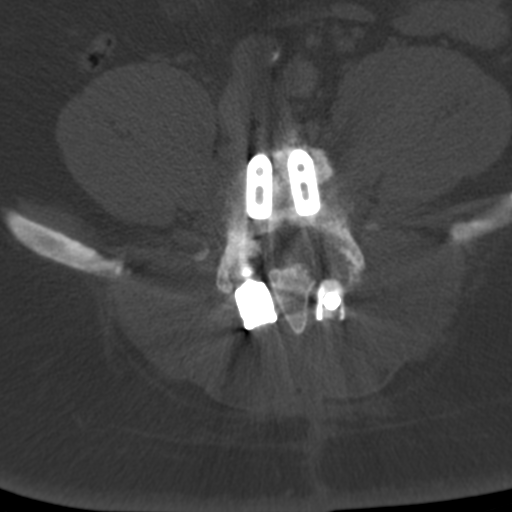
[im 58/115  bone]
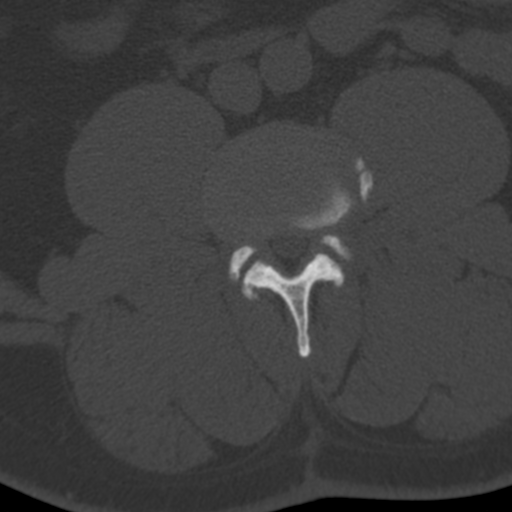
[im 77/115  bone]
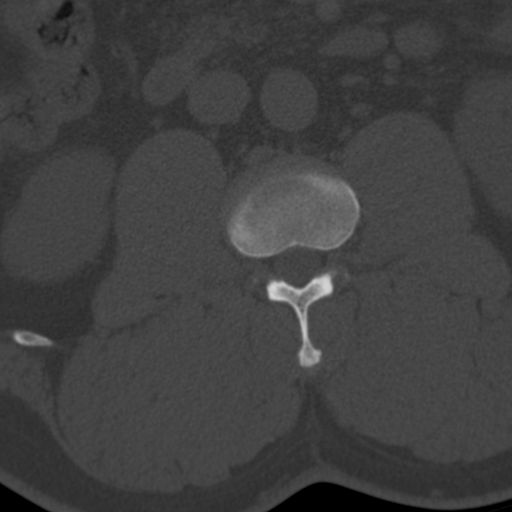
[im 96/115  soft-tissue]
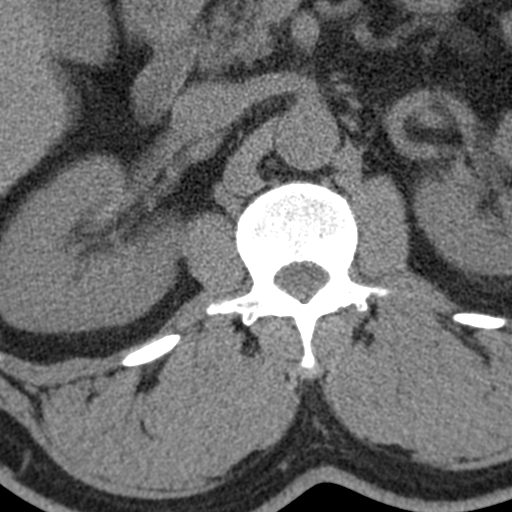
[im 96/115  bone]
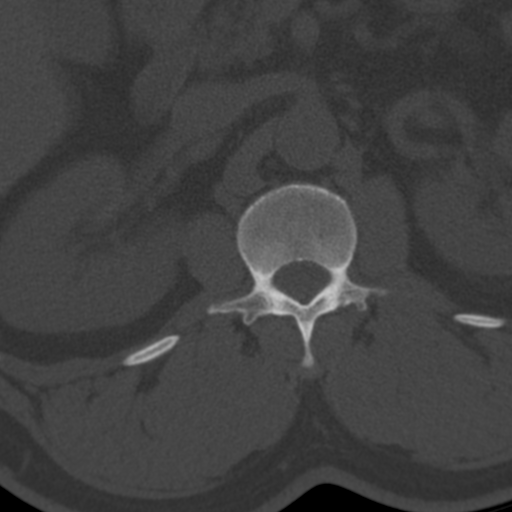

[5 of 14 positions shown; findings below may reference images not displayed]

FINDINGS: Segmentation: 5 lumbar type vertebrae.

Alignment: Trace retrolisthesis at L5-S1.

Vertebrae: Prior posterior interbody fusion at L4-L5. Hardware is
intact without evidence of loosening. Unchanged position of the
intervertebral disc spacer.

Paraspinal and other soft tissues: Negative

Disc levels:

T12-L1: No significant spinal canal or neural foraminal narrowing.

L1-L2: No significant spinal canal or neural foraminal narrowing.

L2-L3: No significant spinal canal or neural foraminal narrowing

L3-L4: Broad-based disc bulging, ligamentum flavum hypertrophy, and
bilateral facet arthropathy results in mild spinal canal narrowing
and bilateral neural foraminal narrowing.

L4-L5: Prior laminectomies. There is posterior bony spurring along
the inferior endplate of L4, protruding into the spinal canal which
may result in mass effect on the thecal sac. No significant neural
foraminal narrowing.

L5-S1: Trace retrolisthesis with mild disc bulging. Mild right
neural foraminal narrowing. No spinal canal stenosis.
IMPRESSION: Prior L4-L5 lumbar decompression and fusion. Intact hardware without
evidence of complication. Posterior bony spurring along the inferior
endplate of L4, which protrudes into the spinal canal.

Mild spinal canal narrowing and bilateral neural foraminal narrowing
at L3-L4.

Mild right neural foraminal narrowing at L5-S1.
